# Patient Record
Sex: Female | Born: 1965 | Race: White | Hispanic: No | Marital: Single | Smoking: Former smoker
Health system: Southern US, Community
[De-identification: ages and names within clinical notes are randomized; demographics above are authoritative.]

## PROBLEM LIST (undated history)

## (undated) DIAGNOSIS — F419 Anxiety disorder, unspecified: Secondary | ICD-10-CM

## (undated) DIAGNOSIS — R7989 Other specified abnormal findings of blood chemistry: Secondary | ICD-10-CM

## (undated) DIAGNOSIS — E079 Disorder of thyroid, unspecified: Secondary | ICD-10-CM

## (undated) DIAGNOSIS — R5383 Other fatigue: Secondary | ICD-10-CM

## (undated) DIAGNOSIS — F32A Depression, unspecified: Secondary | ICD-10-CM

## (undated) DIAGNOSIS — R7303 Prediabetes: Secondary | ICD-10-CM

## (undated) DIAGNOSIS — M6289 Other specified disorders of muscle: Secondary | ICD-10-CM

## (undated) DIAGNOSIS — E669 Obesity, unspecified: Secondary | ICD-10-CM

## (undated) HISTORY — DX: Other fatigue: R53.83

## (undated) HISTORY — DX: Anxiety disorder, unspecified: F41.9

## (undated) HISTORY — DX: Depression, unspecified: F32.A

## (undated) HISTORY — DX: Other specified disorders of muscle: M62.89

## (undated) HISTORY — DX: Other specified abnormal findings of blood chemistry: R79.89

## (undated) HISTORY — DX: Obesity, unspecified: E66.9

## (undated) HISTORY — DX: Prediabetes: R73.03

## (undated) HISTORY — DX: Disorder of thyroid, unspecified: E07.9

---

## 1998-06-27 ENCOUNTER — Other Ambulatory Visit: Admission: RE | Admit: 1998-06-27 | Discharge: 1998-06-27 | Payer: Self-pay | Admitting: Obstetrics and Gynecology

## 2008-05-24 ENCOUNTER — Ambulatory Visit: Payer: Self-pay | Admitting: Family Medicine

## 2009-06-06 ENCOUNTER — Ambulatory Visit: Payer: Self-pay | Admitting: Family Medicine

## 2009-07-09 IMAGING — MG MAM DGTL SCREENING MAMMO W/CAD
1 series · 4 of 4 positions shown · non-contrast
Comparison: none

REASON FOR EXAM: scr mammo
COMMENTS:

PROCEDURE:     MAM - MAM DGTL SCREENING MAMMO W/CAD  - May 24, 2008  [DATE]
RESULT:       There are no prior mammograms for comparison.  The breast
parenchyma is almost entirely fatty.  No mass or malignant-appearing
calcifications are seen.

[R CC · right · 4 of 4 slices shown]
[im 1/4]
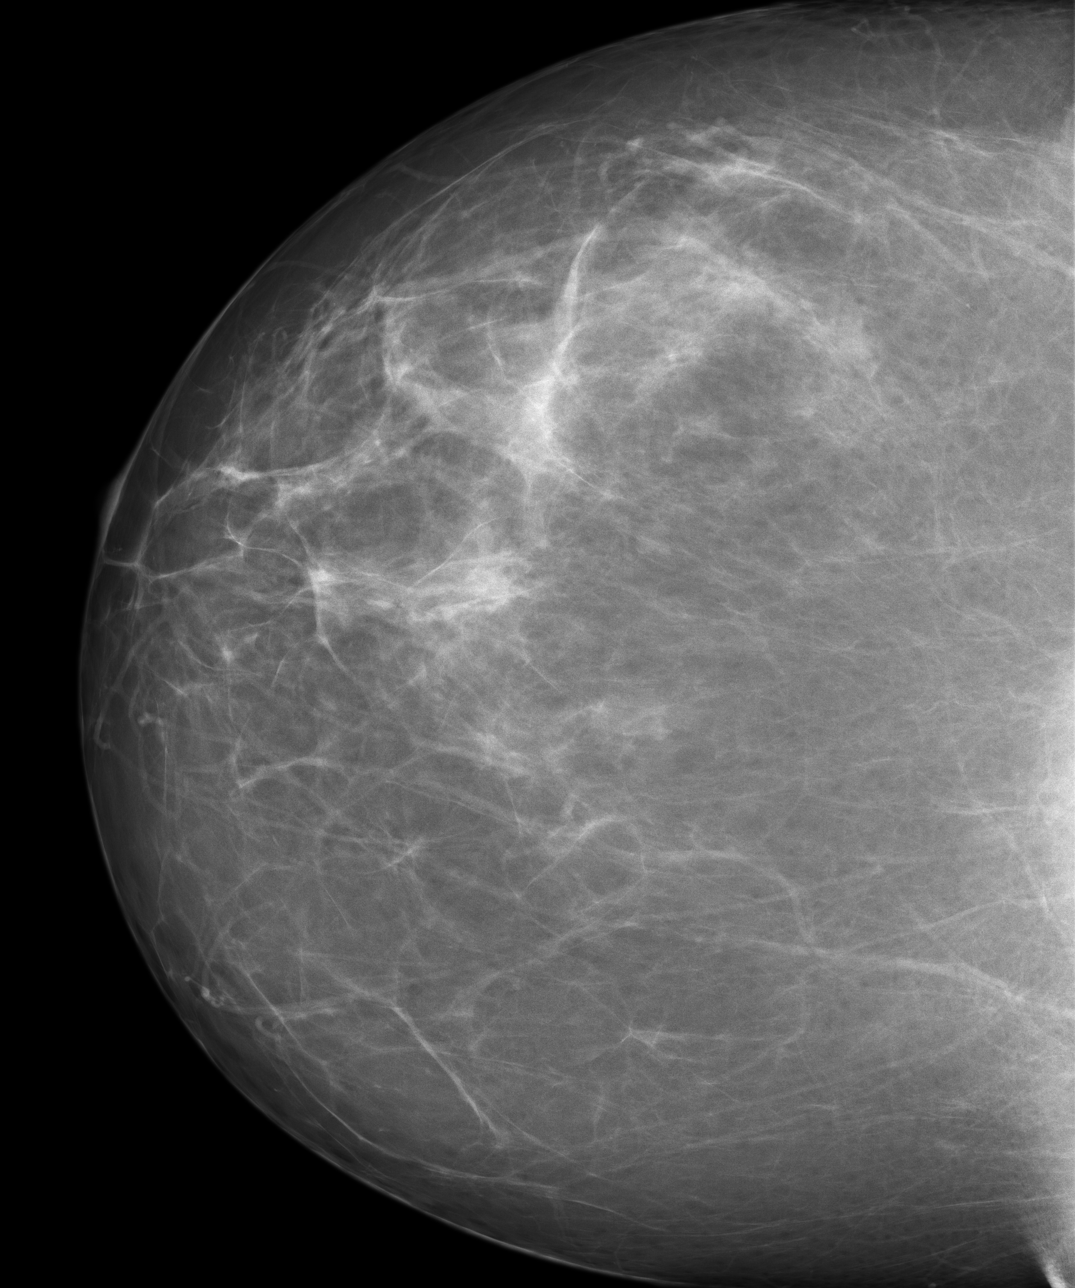
[im 2/4]
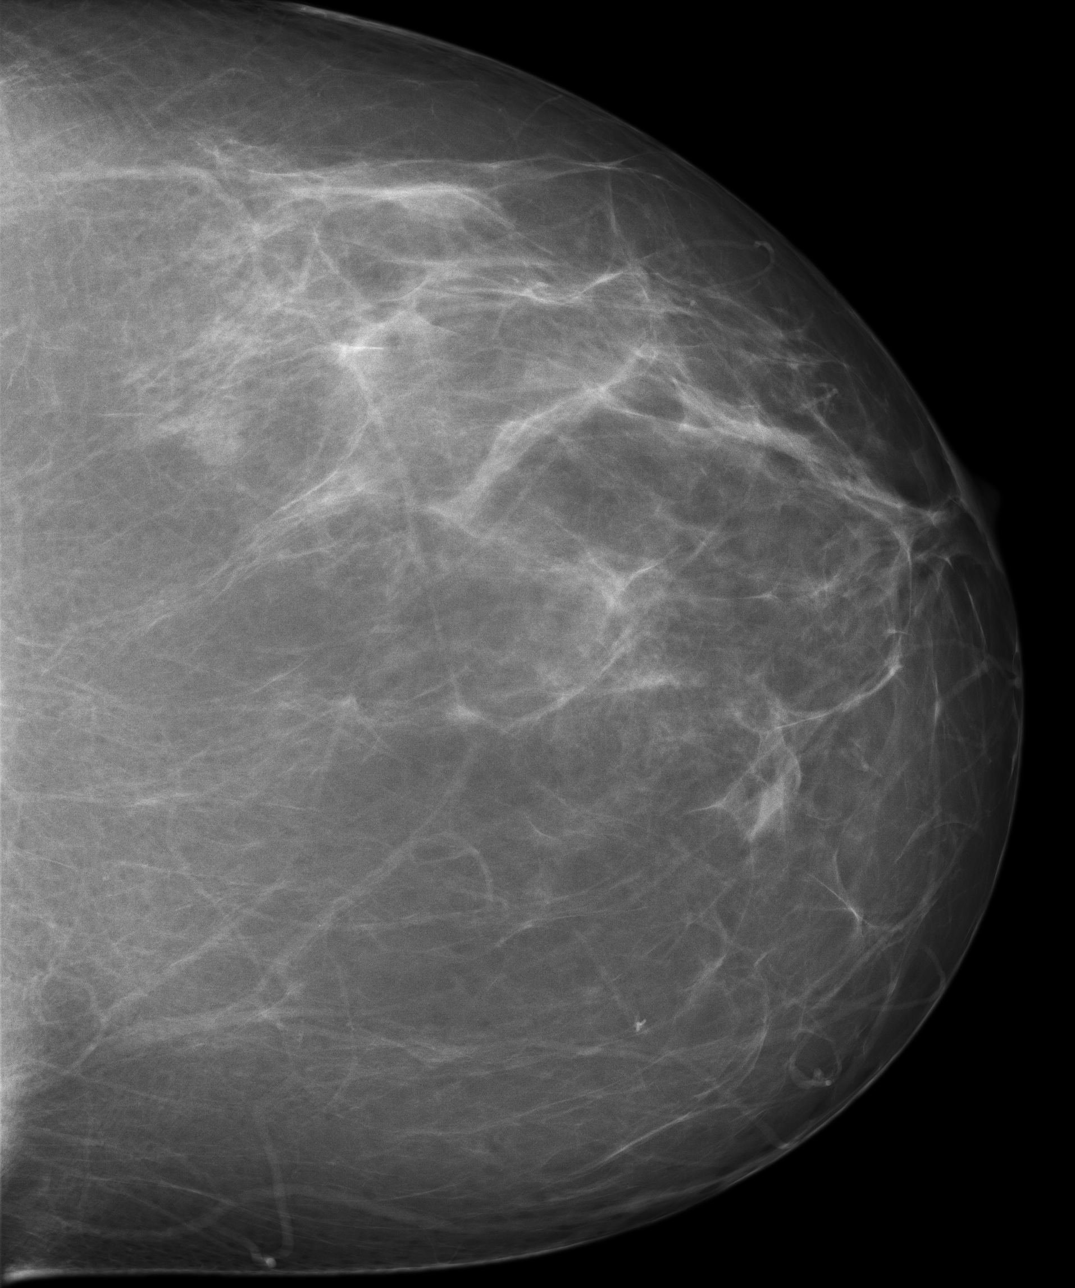
[im 3/4]
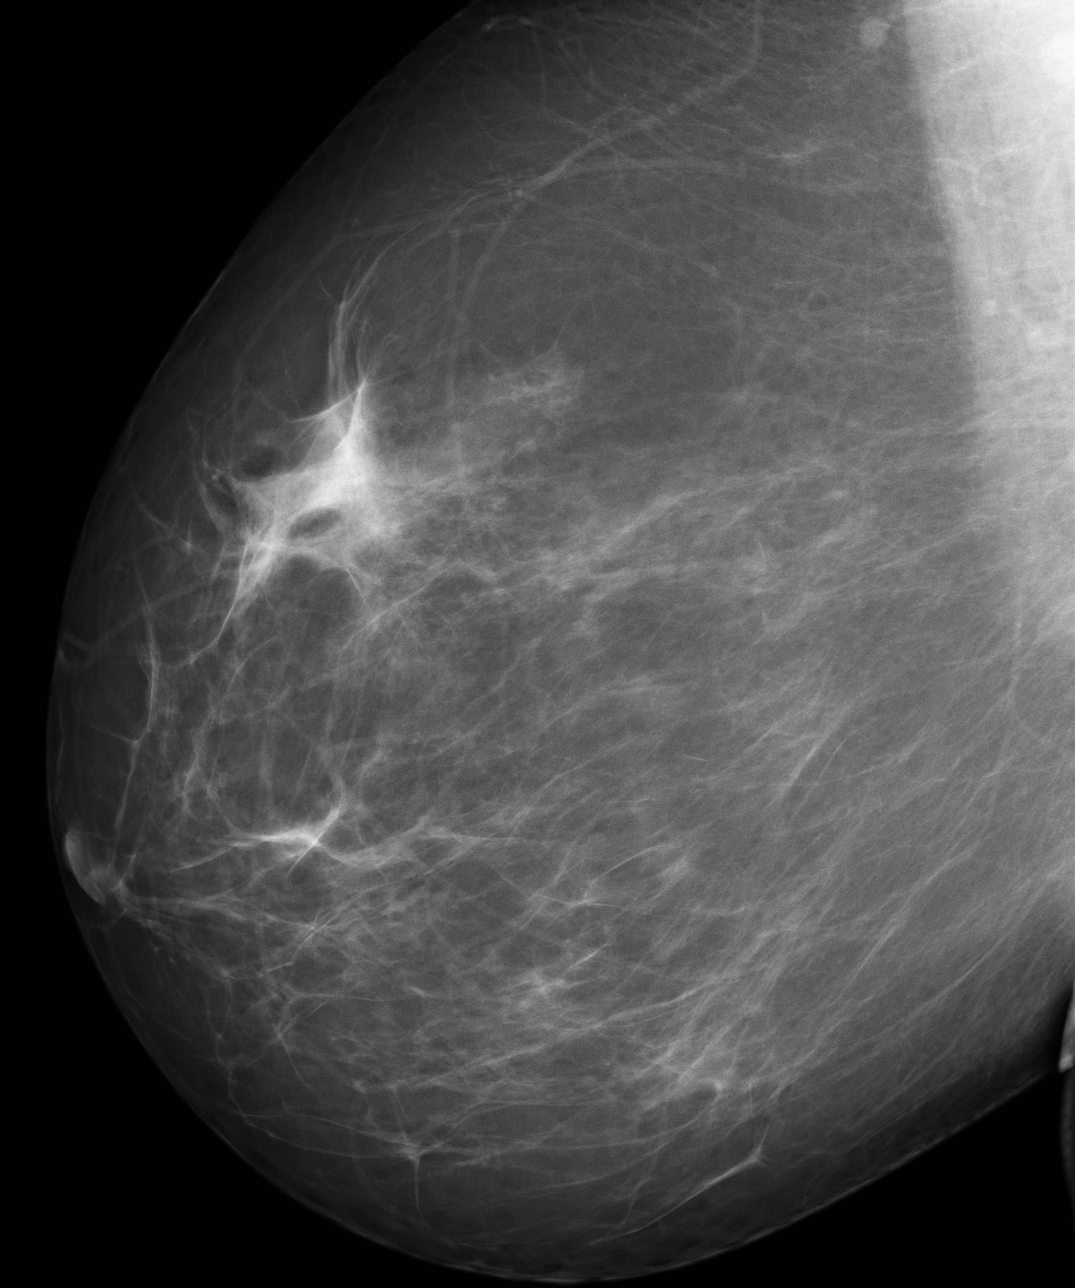
[im 4/4]
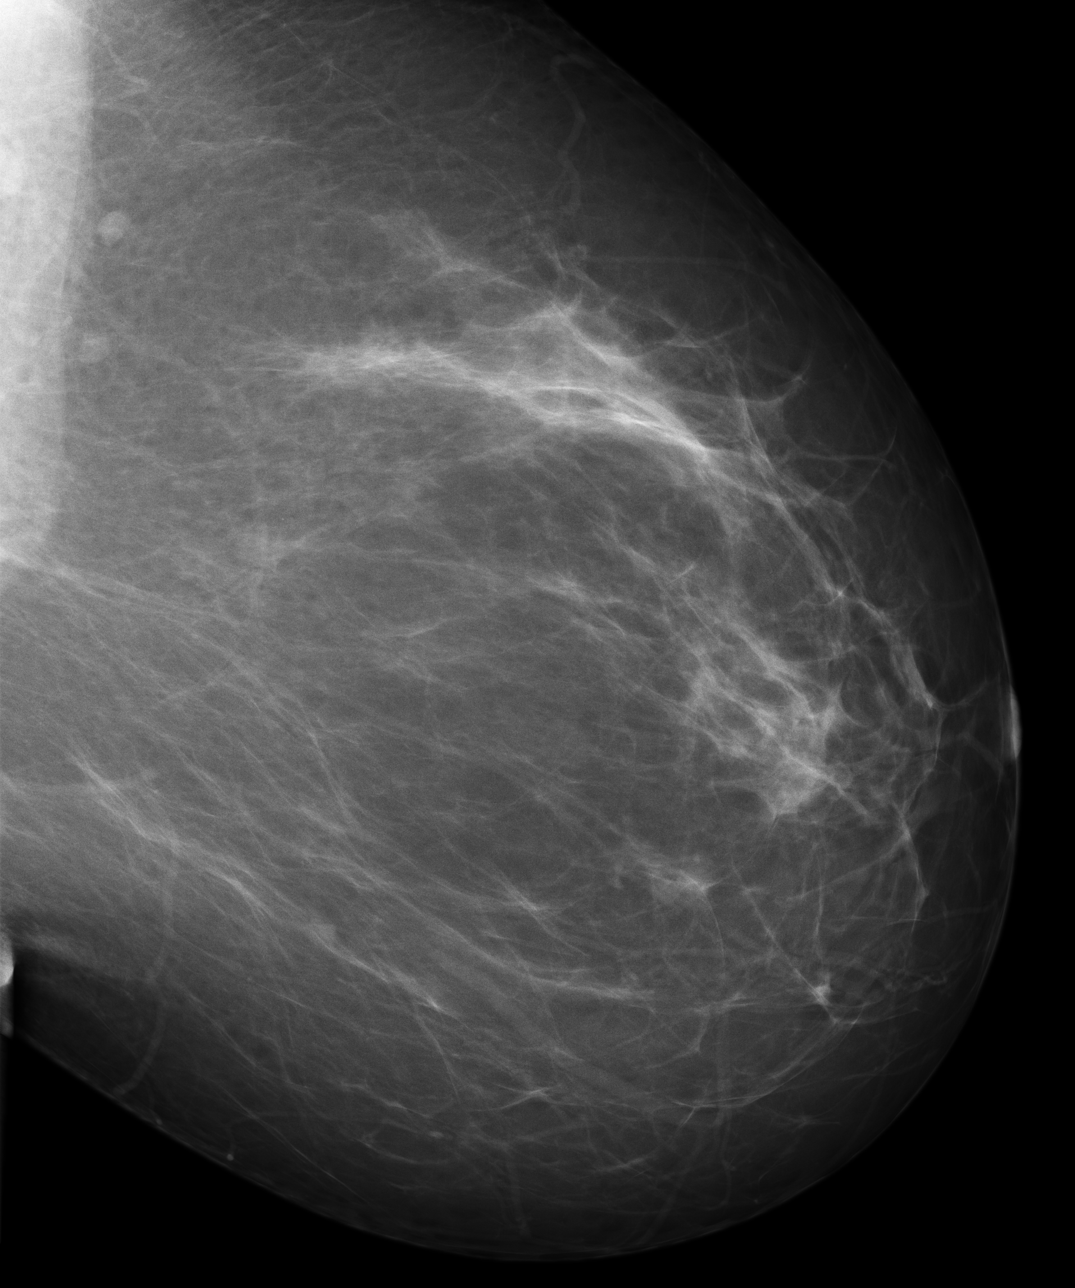

[4 of 4 positions shown; findings below may reference images not displayed]

IMPRESSION: 1.     Bilaterally benign-appearing baseline mammogram.
2.     Annual screening mammography is recommended.
3.     BI-RADS:  Category 1-Negative.

A NEGATIVE MAMMOGRAM REPORT DOES NOT PRECLUDE BIOPSY OR OTHER EVALUATION OF
A CLINICALLY PALPABLE OR OTHERWISE SUSPICIOUS MASS OR LESION.  BREAST CANCER
MAY NOT BE DETECTED BY MAMMOGRAPHY IN UP TO 10% OF CASES.

## 2009-07-10 ENCOUNTER — Ambulatory Visit: Payer: Self-pay | Admitting: Family Medicine

## 2009-08-08 ENCOUNTER — Ambulatory Visit: Payer: Self-pay | Admitting: Urology

## 2010-06-24 ENCOUNTER — Ambulatory Visit: Payer: Self-pay

## 2010-09-23 IMAGING — CT CT ABD-PELV W/ CM
1 of 3 series · 12 of 32 positions shown, 18 images · non-contrast
Comparison: none

REASON FOR EXAM: w delays  hematuria
COMMENTS:

[Series 2: with · axial · 0.79mm/px · z∈[+158,+562]mm · 12 of 97 slices shown, 18 images]
[im 8/97  soft-tissue]
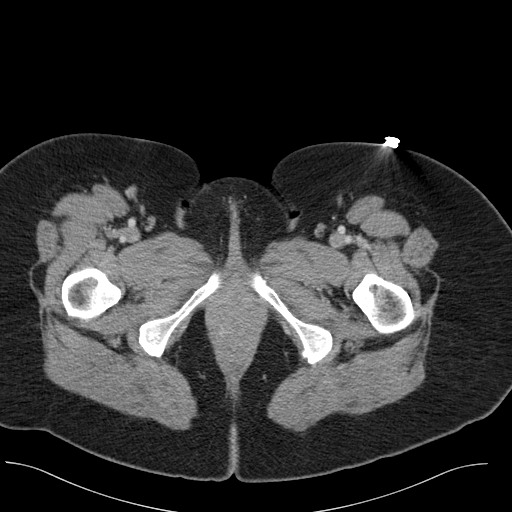
[im 8/97  bone]
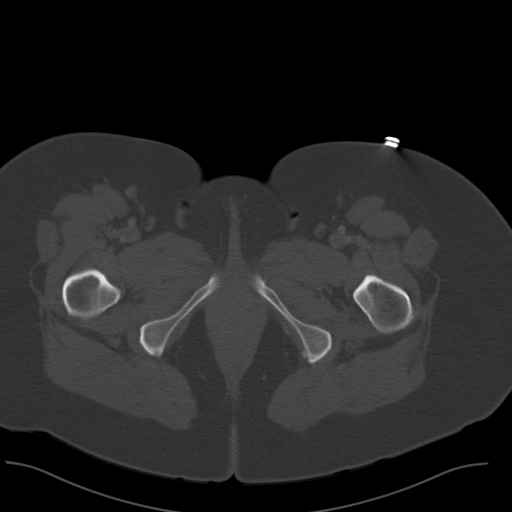
[im 15/97  soft-tissue]
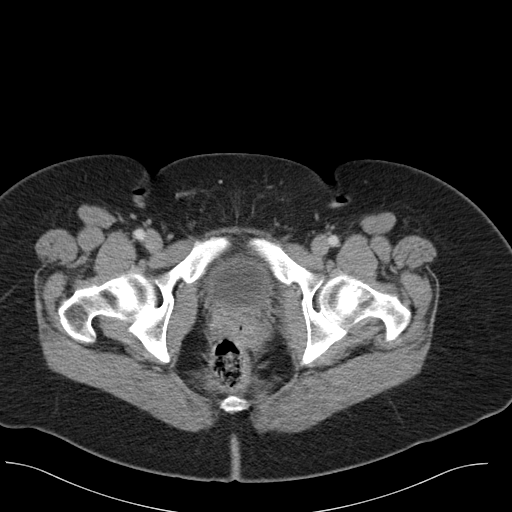
[im 23/97  soft-tissue]
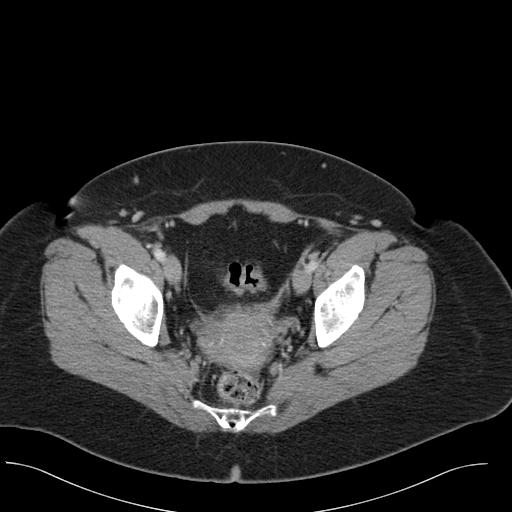
[im 30/97  soft-tissue]
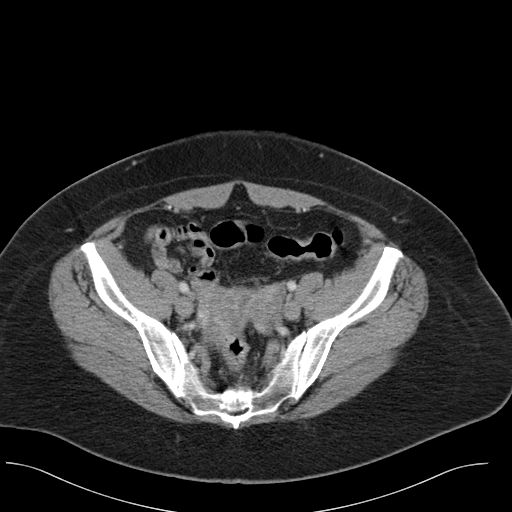
[im 37/97  soft-tissue]
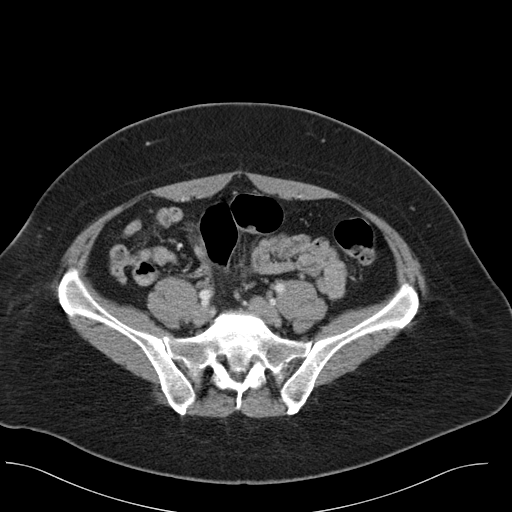
[im 45/97  soft-tissue]
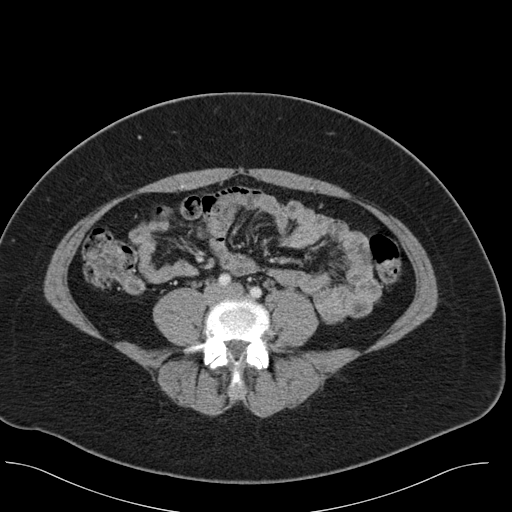
[im 52/97  soft-tissue]
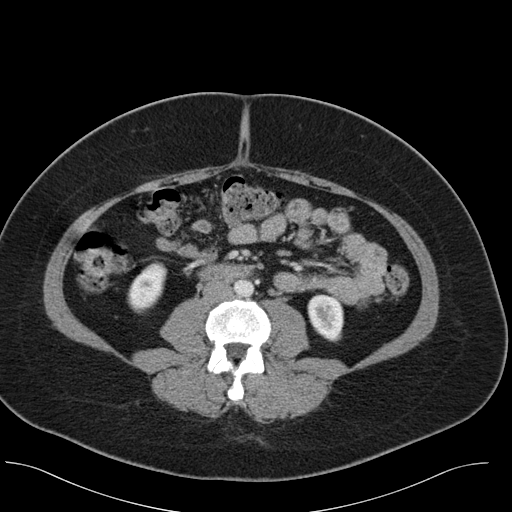
[im 60/97  soft-tissue]
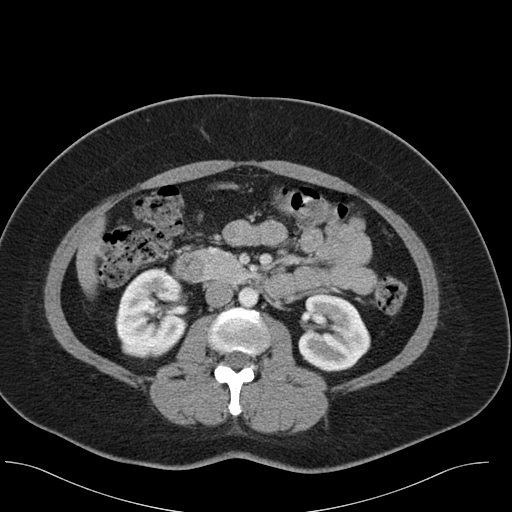
[im 67/97  soft-tissue]
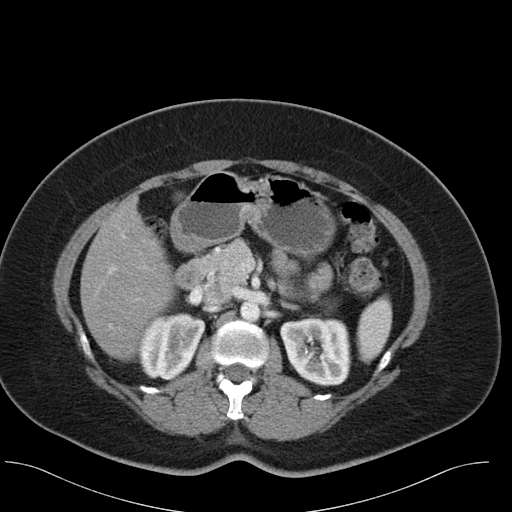
[im 67/97  lung]
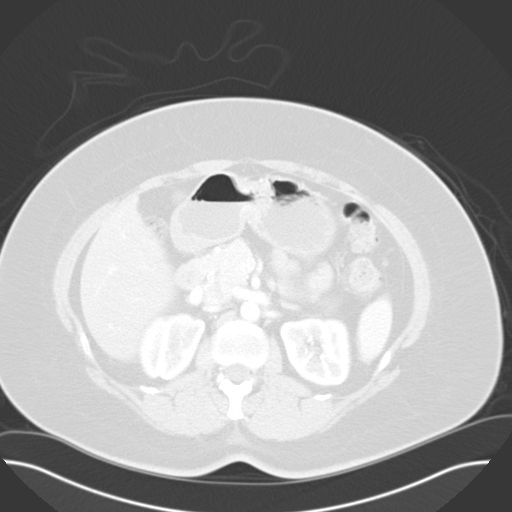
[im 67/97  bone]
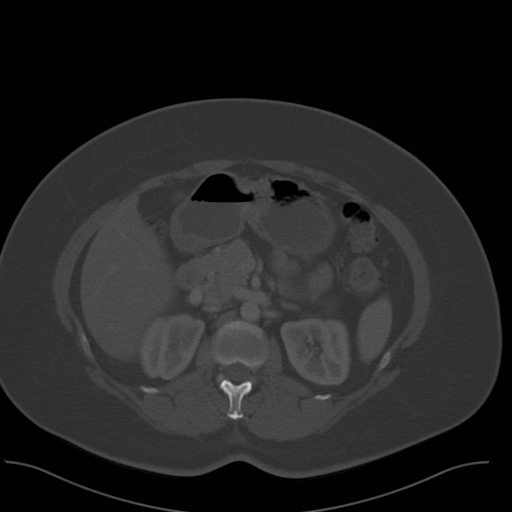
[im 74/97  soft-tissue]
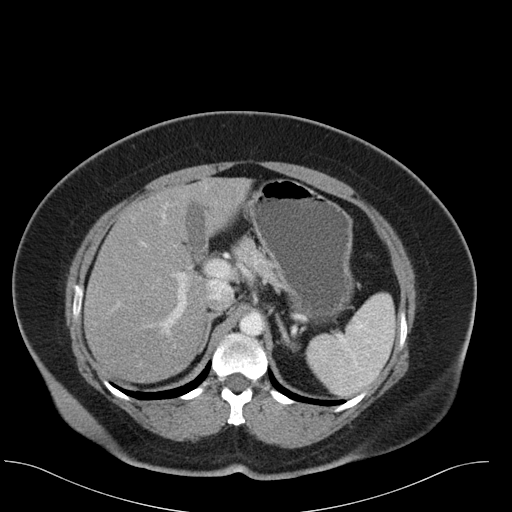
[im 74/97  lung]
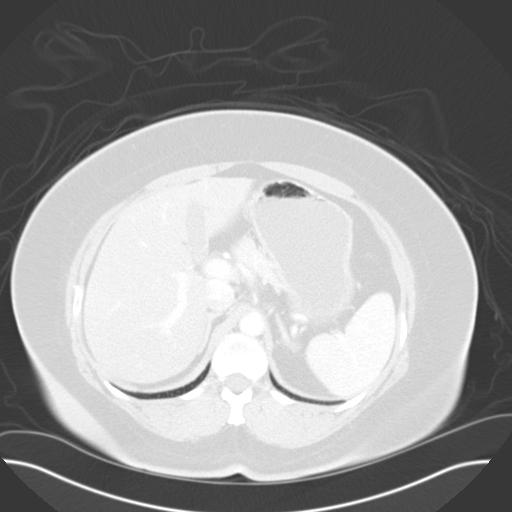
[im 82/97  soft-tissue]
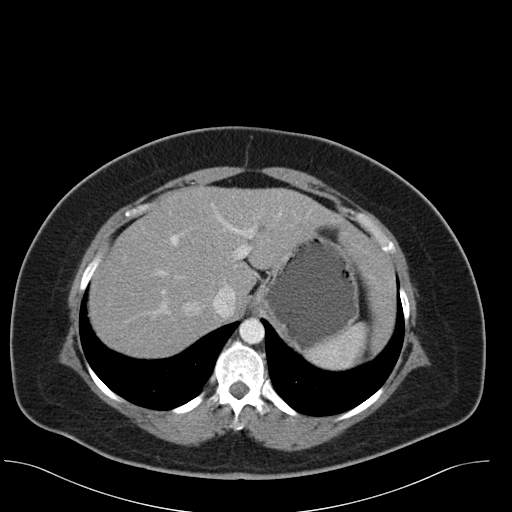
[im 82/97  lung]
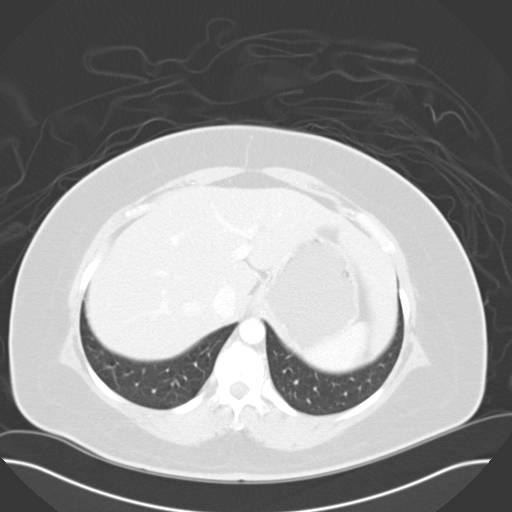
[im 89/97  soft-tissue]
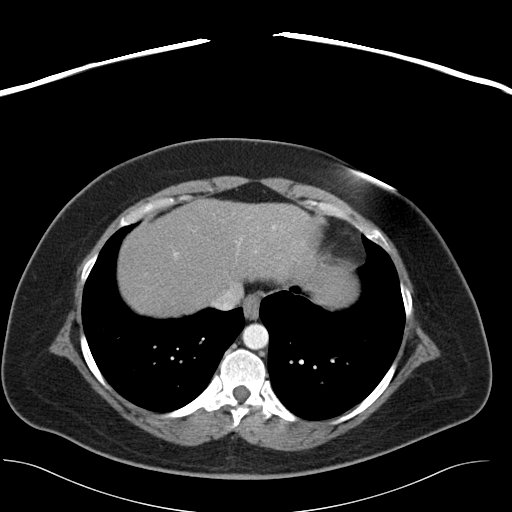
[im 89/97  lung]
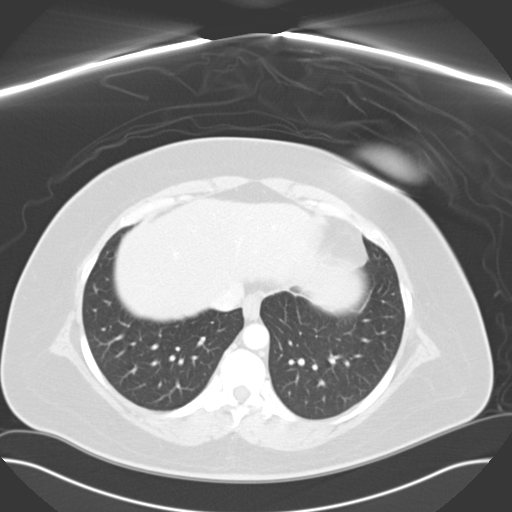

[12 of 32 positions shown; findings below may reference images not displayed]

PROCEDURE:     CT  - CT ABDOMEN / PELVIS  W  - August 08, 2009  [DATE]

RESULT:     Axial CT scanning was performed through the abdomen and pelvis
at 5 mm intervals and slice thicknesses following intravenous administration
of 100 cc of [id number]. Delayed images were obtained as well. Review of
3-dimensional reconstructed images was performed separately on the [employer]
Server monitor.

The kidneys enhance well. There is a nonobstructing mid pole stone measuring
approximately 2 mm in diameter on the left. A calcification on image 39 in
the mid to lower pole on the left may reflect a 3 mm diameter nonobstructing
stone or be related to a vascular location. I do not see definite stones on
the right. The perinephric fat is normal in appearance. On delayed images
contrast within the renal collecting systems is normal. The ureters are
normal in course and caliber. The partially distended urinary bladder is
grossly normal.

The liver exhibits no focal mass nor ductal dilation. The gallbladder,
moderately distended stomach, pancreas, spleen, and adrenal glands are
normal in appearance. The caliber of the abdominal aorta is normal. I see no
periaortic or pericaval lymphadenopathy. Within the pelvis the uterus and
adnexal structures exhibit no definite acute abnormality. There are likely
cystic ovarian processes bilaterally. The uterus also appears bicornuate. I
see no evidence of an inguinal hernia. The lung bases are clear. The lumbar
vertebral bodies are preserved in height.
IMPRESSION: 1. I do not see acute abnormality of the kidneys or ureters. The urinary
bladder is partially distended and is grossly normal. If hematuria persists
and remains unexplained, cystoscopy may be useful.
2. There is mild fullness of the adnexal regions which may reflect cystic
ovarian processes. The uterus may be bicornuate is well. Pelvic ultrasound
is recommended.
3. I do not see acute bowel abnormality nor acute hepatobiliary abnormality.

## 2011-06-30 ENCOUNTER — Ambulatory Visit: Payer: Self-pay | Admitting: Family Medicine

## 2012-07-28 ENCOUNTER — Ambulatory Visit: Payer: Self-pay | Admitting: Family Medicine

## 2012-08-14 IMAGING — MG MM CAD SCREENING MAMMO
1 series · 3 of 3 positions shown · non-contrast
Comparison: none

REASON FOR EXAM: scr mammo no order
COMMENTS:

PROCEDURE:     MAM - MAM DGTL SCRN MAM NO ORDER W/CAD  - June 30, 2011  [DATE]
RESULT:     Comparison is made to prior examinations June 24, 2010
and May 24, 2008.
They are scattered fibroglandular densities bilaterally. A few retromammary
and axillary lymph nodes are observed bilaterally.

[L CC · left · 3 of 3 slices shown]
[im 1/3]
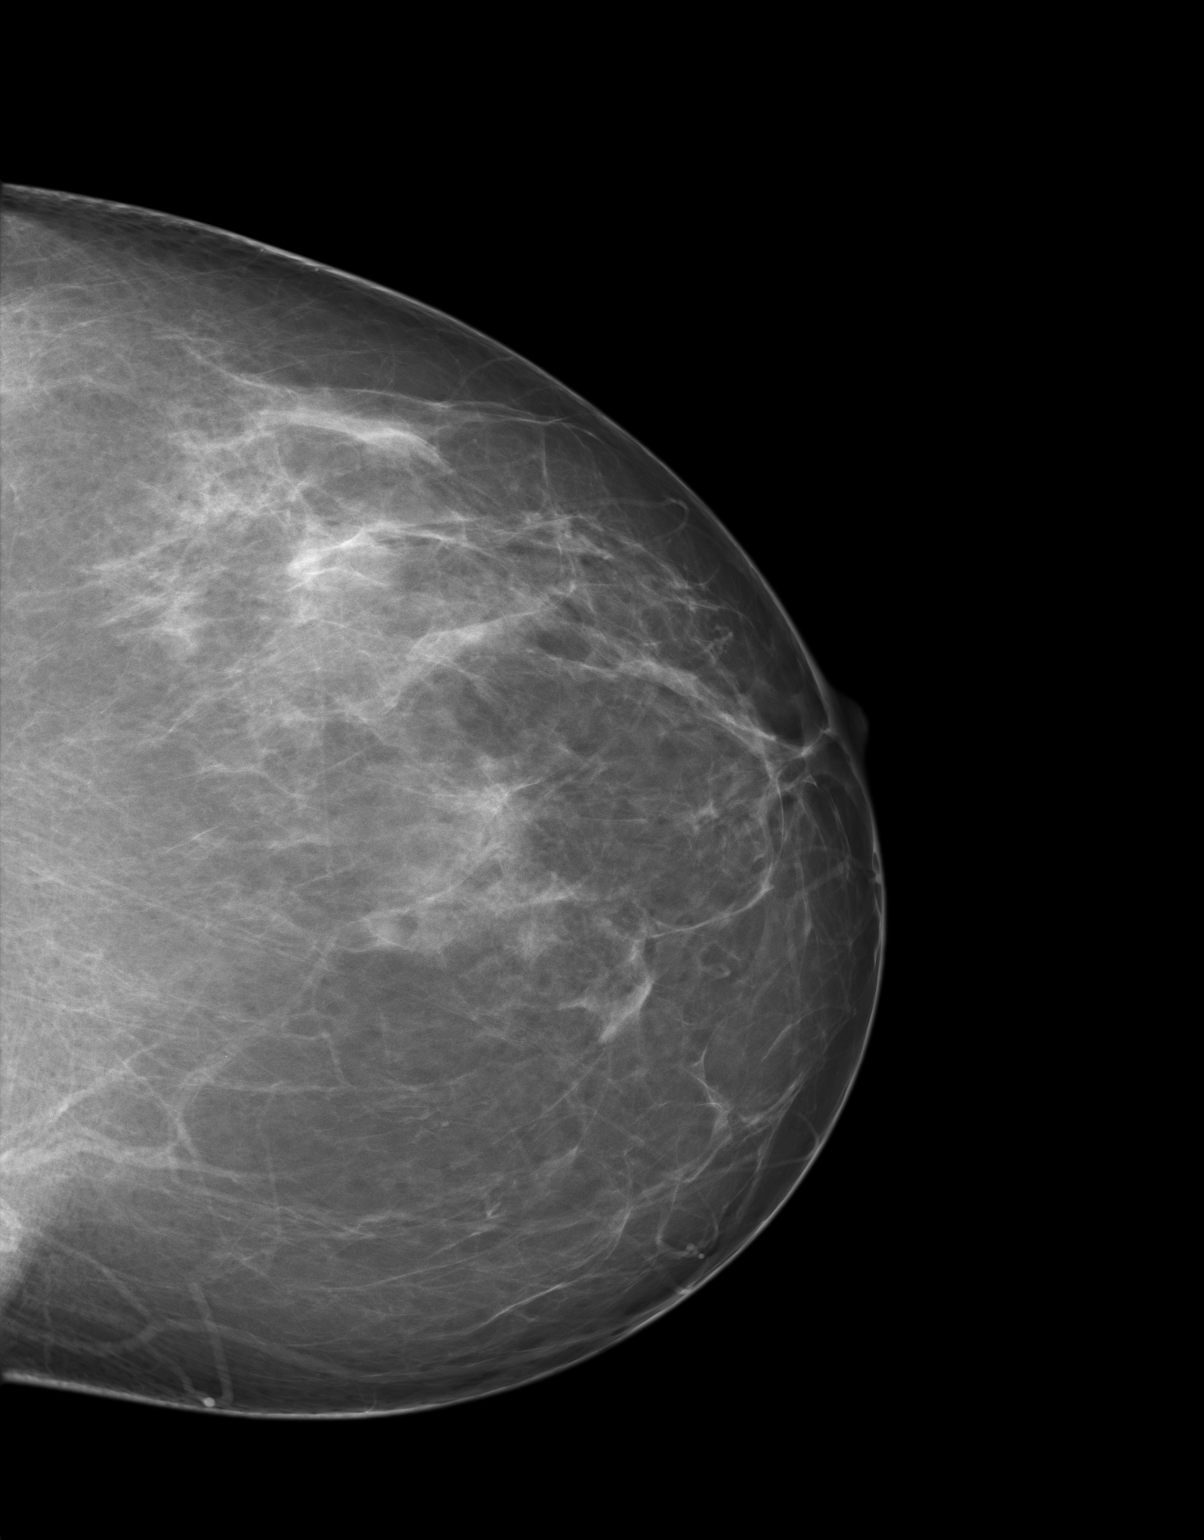
[im 2/3]
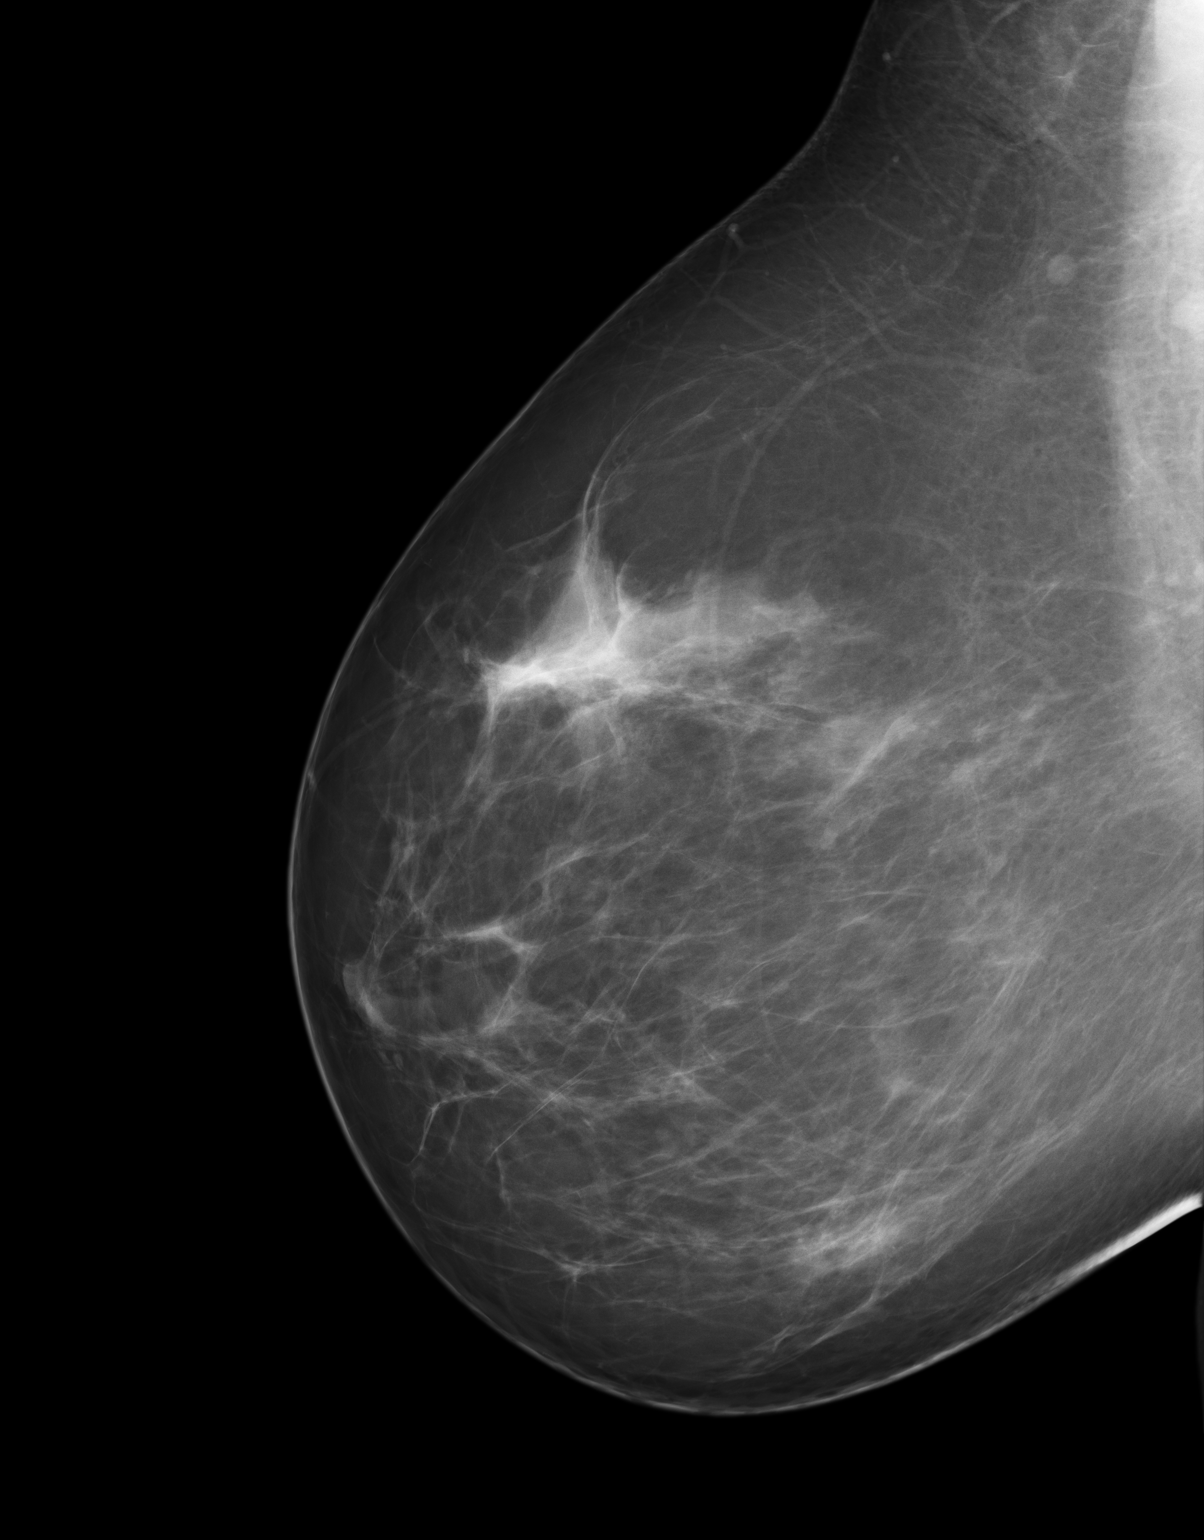
[im 3/3]
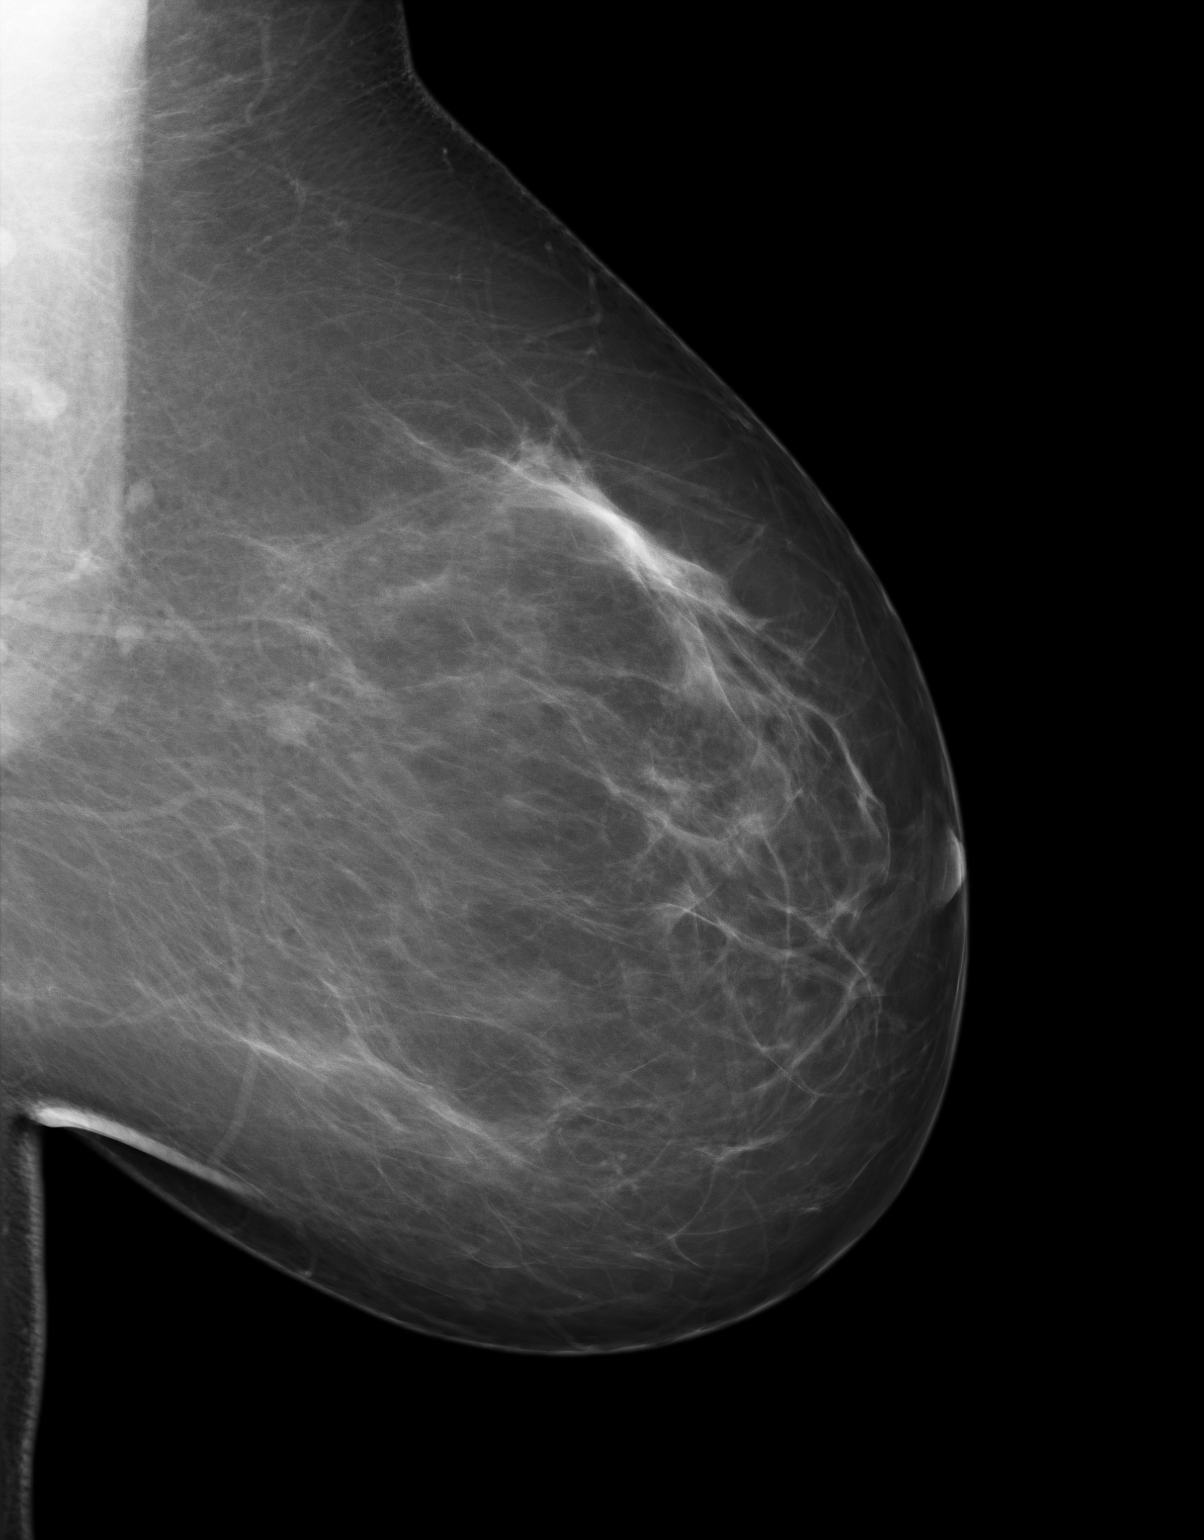

[3 of 3 positions shown; findings below may reference images not displayed]

IMPRESSION: 1. Bilateral benign-appearing screening mammography.
3. Continued annual screening mammography is recommended.
3. BI-RADS:  Category 2- Benign Finding.

A negative mammogram report does not preclude biopsy or other evaluation of
a clinically palpable or otherwise suspicious mass or lesion.  Breast cancer
may not be detected by mammography in up to 10% of cases.

## 2014-06-25 ENCOUNTER — Ambulatory Visit: Payer: Self-pay | Admitting: Family Medicine

## 2015-06-24 ENCOUNTER — Encounter: Payer: Self-pay | Admitting: Physician Assistant

## 2015-06-24 ENCOUNTER — Ambulatory Visit (INDEPENDENT_AMBULATORY_CARE_PROVIDER_SITE_OTHER): Payer: 59 | Admitting: Physician Assistant

## 2015-06-24 VITALS — BP 132/62 | HR 85 | Resp 18 | Ht 60.0 in | Wt 234.2 lb

## 2015-06-24 DIAGNOSIS — S61011A Laceration without foreign body of right thumb without damage to nail, initial encounter: Secondary | ICD-10-CM

## 2015-06-24 DIAGNOSIS — M2550 Pain in unspecified joint: Secondary | ICD-10-CM

## 2015-06-24 DIAGNOSIS — S61219A Laceration without foreign body of unspecified finger without damage to nail, initial encounter: Secondary | ICD-10-CM

## 2015-06-24 NOTE — Progress Notes (Signed)
   06/24/2015 8:45 PM   DOB: 1966/03/17 / MRN: [id number]  SUBJECTIVE:  Christina CablesRebecca Brennan is a 49 y.o. female presenting for after slicing the tip of her right thumb off with a mandolin slicer at roughly 7 pm.  She has had a difficult time stopping the bleeding and has kept her thumb in her mouth to aid in hemostasis.  Last tetanus shot was 8 years ago. She denies difficulty with ROM of the thumb.   She has No Known Allergies.   She  has no past medical history on file.    She   She  has no sexual activity history on file. The patient  has no past surgical history on file.  Her family history is not on file.  Review of Systems  Constitutional: Negative for fever.  Musculoskeletal: Positive for joint pain. Negative for myalgias.  Neurological: Negative for tingling.    Problem list and medications reviewed and updated by myself where necessary, and exist elsewhere in the encounter.   OBJECTIVE:  BP 132/62 mmHg  Pulse 85  Resp 18  Ht 5' (1.524 m)  Wt 234 lb 3.2 oz (106.232 kg)  BMI 45.74 kg/m2  SpO2 98%  LMP 06/10/2015 CrCl cannot be calculated (Patient has no serum creatinine result on file.).  Physical Exam  Constitutional: She is oriented to person, place, and time.  Cardiovascular: Normal rate.   Pulmonary/Chest: Effort normal.  Musculoskeletal:       Hands: Neurological: She is alert and oriented to person, place, and time.  Skin: Skin is warm and dry.  Psychiatric: Her mood appears anxious.   Risk and benefits discussed and verbal consent obtained. Anesthetic allergies reviewed. Patient anesthetized using 1:1 mix of 2% lidocaine without epi and Marcaine. The wound was cleansed thoroughly with NS and betadine prep. Sterile drape. Wound closed with aluminum foil scrubbed times 3 with betadine. Foil adhered using 5 SI throws using 4-0 Ethilon suture material. Hemostasis achieved. Mupirocin applied to the wound and bandage placed. The patient tolerated well. Wound instructions  were provided and the patient is to return in 10 days for suture removal.   No results found for this or any previous visit (from the past 48 hour(s)).  ASSESSMENT AND PLAN  Christina JoinerRebecca was seen today for other.  Diagnoses and all orders for this visit:  Finger laceration, initial encounter: Advised ibuprofen 600-800 mg q8 for pain control. Advised Vaseline and band-aid for future dressing changes.  If any problems she should return to clinic, otherwise will see her back in 10 days for foil and suture removal.     The patient was advised to call or return to clinic if she does not see an improvement in symptoms or to seek the care of the closest emergency department if she worsens with the above plan.   Christina BostonMichael Brennan, MHS, PA-C [facility] 06/24/2015 8:45 PM

## 2015-06-24 NOTE — Patient Instructions (Signed)
Please take 600-800 mg of Ibuprofen every 8 hours for pain. When caring for the wound, please change the dressing every 24 hours and replace tonight's bandage with a bandaid and vaseline.  WOUND CARE Please return in 10 days to have your stitches/staples removed or sooner if you have concerns. Marland Kitchen. Keep area clean and dry for 24 hours. Do not remove bandage, if applied. . After 24 hours, remove bandage and wash wound gently with mild soap and warm water. Reapply a new bandage after cleaning wound, if directed. . Continue daily cleansing with soap and water until stitches/staples are removed. . Do not apply any ointments or creams to the wound while stitches/staples are in place, as this may cause delayed healing. . Notify the office if you experience any of the following signs of infection: Swelling, redness, pus drainage, streaking, fever >101.0 F . Notify the office if you experience excessive bleeding that does not stop after 15-20 minutes of constant, firm pressure.

## 2015-06-26 ENCOUNTER — Telehealth: Payer: Self-pay | Admitting: Physician Assistant

## 2015-06-26 NOTE — Telephone Encounter (Signed)
Pt called to confirm that she had changed her dressing; there was only a little bit of blood and she states she will be able to come in for the 10 day follow up. Thank you!  [phone]

## 2015-07-09 ENCOUNTER — Ambulatory Visit (INDEPENDENT_AMBULATORY_CARE_PROVIDER_SITE_OTHER): Payer: 59 | Admitting: Physician Assistant

## 2015-07-09 VITALS — BP 120/60 | HR 100 | Temp 98.3°F | Resp 18

## 2015-07-09 DIAGNOSIS — S61219D Laceration without foreign body of unspecified finger without damage to nail, subsequent encounter: Secondary | ICD-10-CM

## 2015-07-09 DIAGNOSIS — S61011D Laceration without foreign body of right thumb without damage to nail, subsequent encounter: Secondary | ICD-10-CM

## 2015-07-09 DIAGNOSIS — Z4802 Encounter for removal of sutures: Secondary | ICD-10-CM

## 2015-07-09 NOTE — Progress Notes (Signed)
   Christina CablesRebecca Brennan  MRN: [id number] DOB: October 09, 1965  Subjective:  Pt presents to clinic for a wound recheck.  She had a foil graft placed on 11/28 after an avulsion injury on her right thumb.  It is still tender when she touches it but it is getting better.    There are no active problems to display for this patient.   No current outpatient prescriptions on file prior to visit.   No current facility-administered medications on file prior to visit.    No Known Allergies  Review of Systems  Constitutional: Negative for fever and chills.   Objective:  BP 120/60 mmHg  Pulse 100  Temp(Src) 98.3 F (36.8 C) (Oral)  Resp 18  SpO2 97%  LMP 06/10/2015  Physical Exam  Constitutional: She is oriented to person, place, and time and well-developed, well-nourished, and in no distress.  HENT:  Head: Normocephalic and atraumatic.  Right Ear: Hearing and external ear normal.  Left Ear: Hearing and external ear normal.  Eyes: Conjunctivae are normal.  Neck: Normal range of motion.  Pulmonary/Chest: Effort normal.  Neurological: She is alert and oriented to person, place, and time. Gait normal.  Skin: Skin is warm and dry.  Foil graft removed - about 50% healed but the foil was not attached fully so it had to be removed - xeroform gauze placed over the wound and then a drsg placed.  Psychiatric: Mood, memory, affect and judgment normal.  Vitals reviewed.   Assessment and Plan :  Finger laceration, subsequent encounter - Plan: Apply dressing  Encounter for removal of sutures   Wound care d/w pt.  Christina LennertSarah Leata Dominy PA-C  [facility] 07/09/2015 5:37 PM

## 2015-08-10 IMAGING — MG MM DIGITAL SCREENING BILAT W/ CAD
5 series · 5 of 5 positions shown · non-contrast
Comparison: Previous exam(s).

CLINICAL DATA: Screening.

EXAM:
DIGITAL SCREENING BILATERAL MAMMOGRAM WITH CAD

[R MLO]
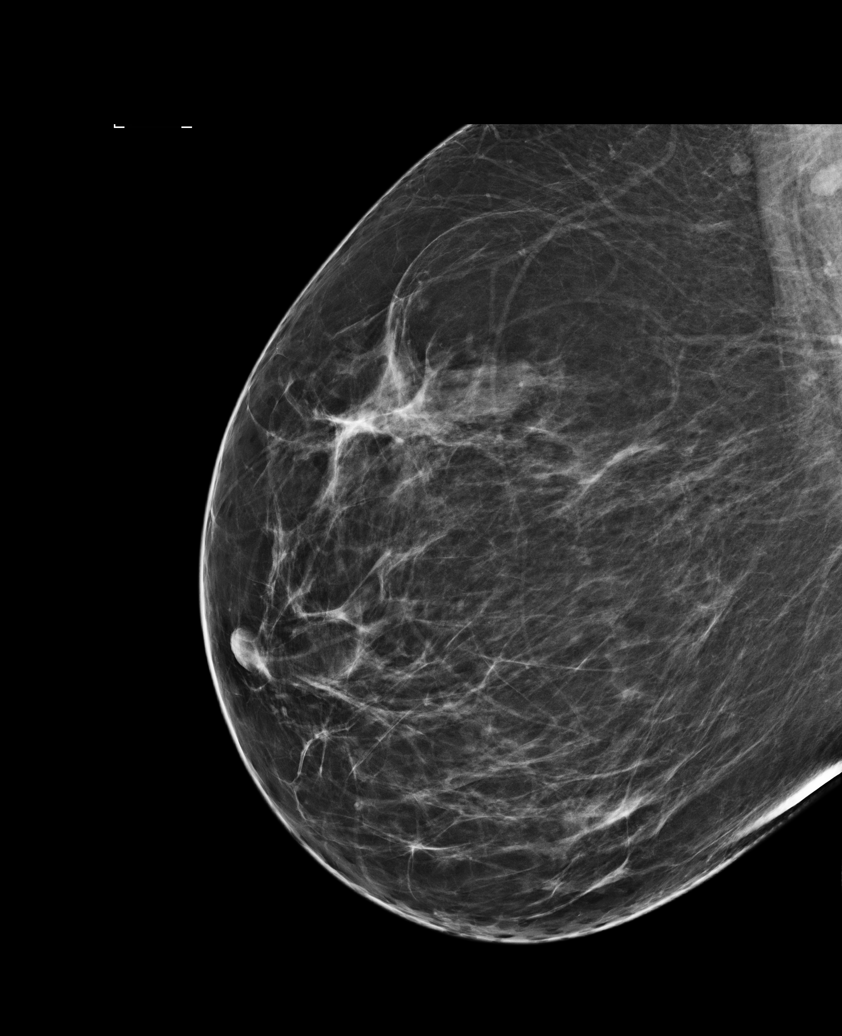

[L MLO (1 of 2)]
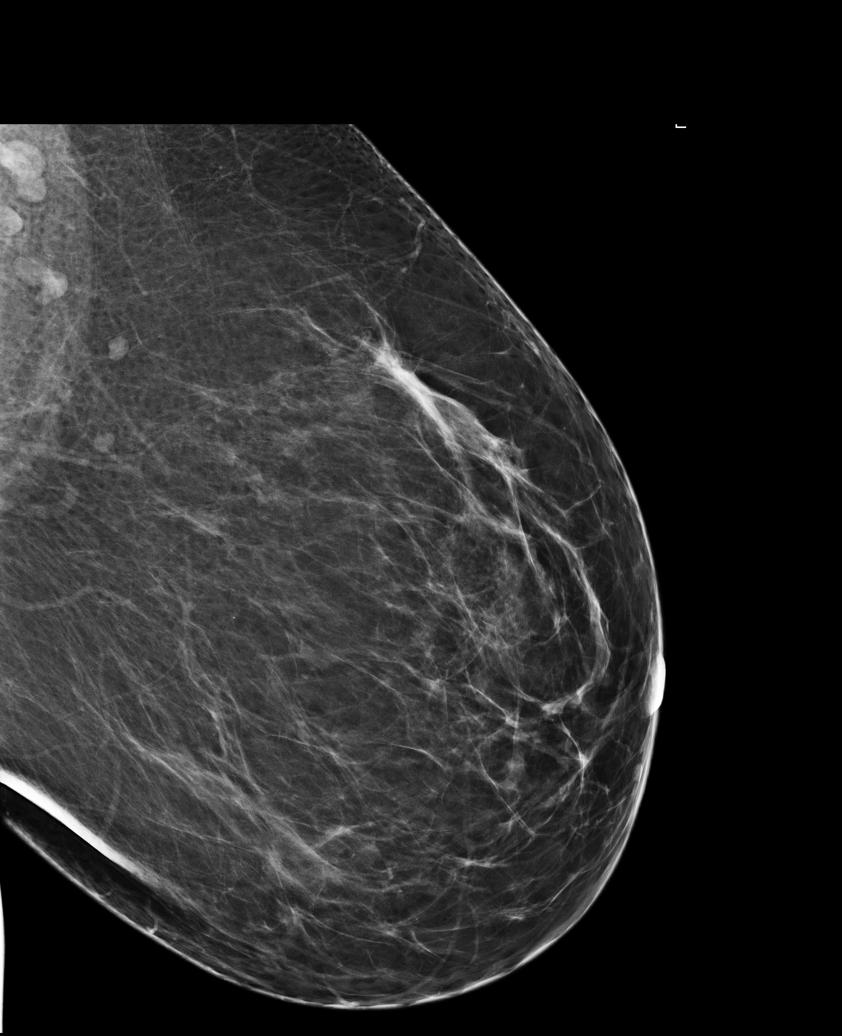

[R CC]
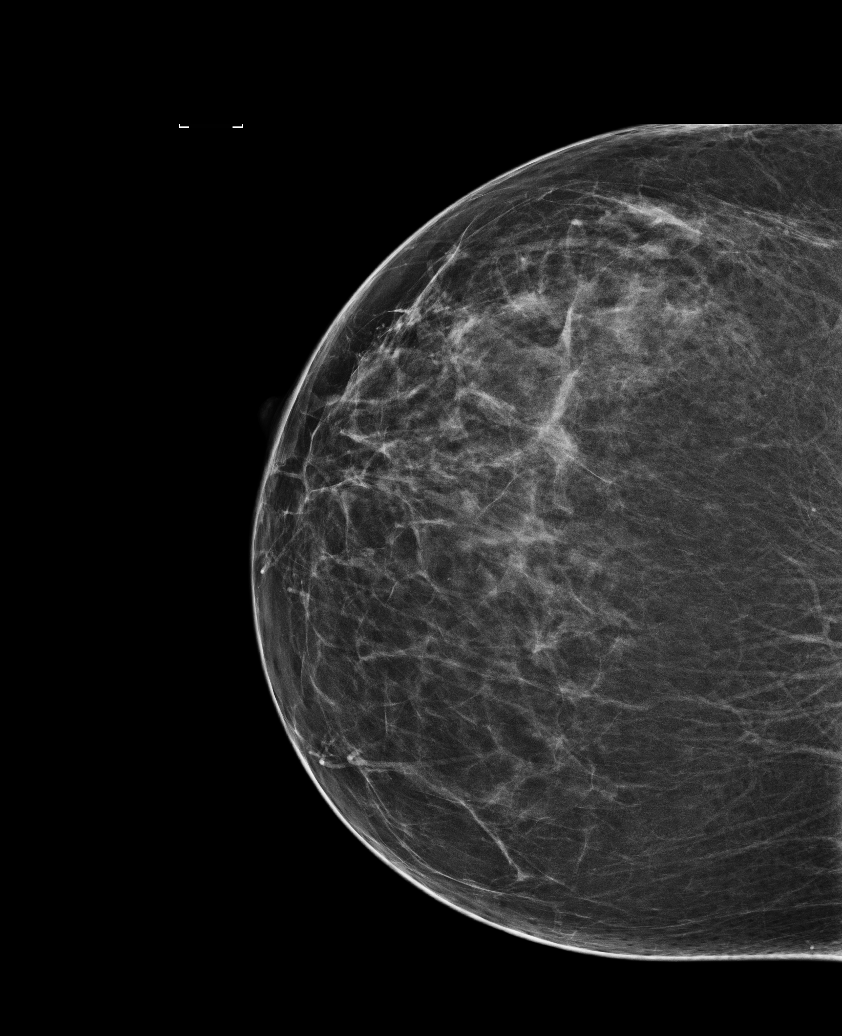

[L MLO (2 of 2)]
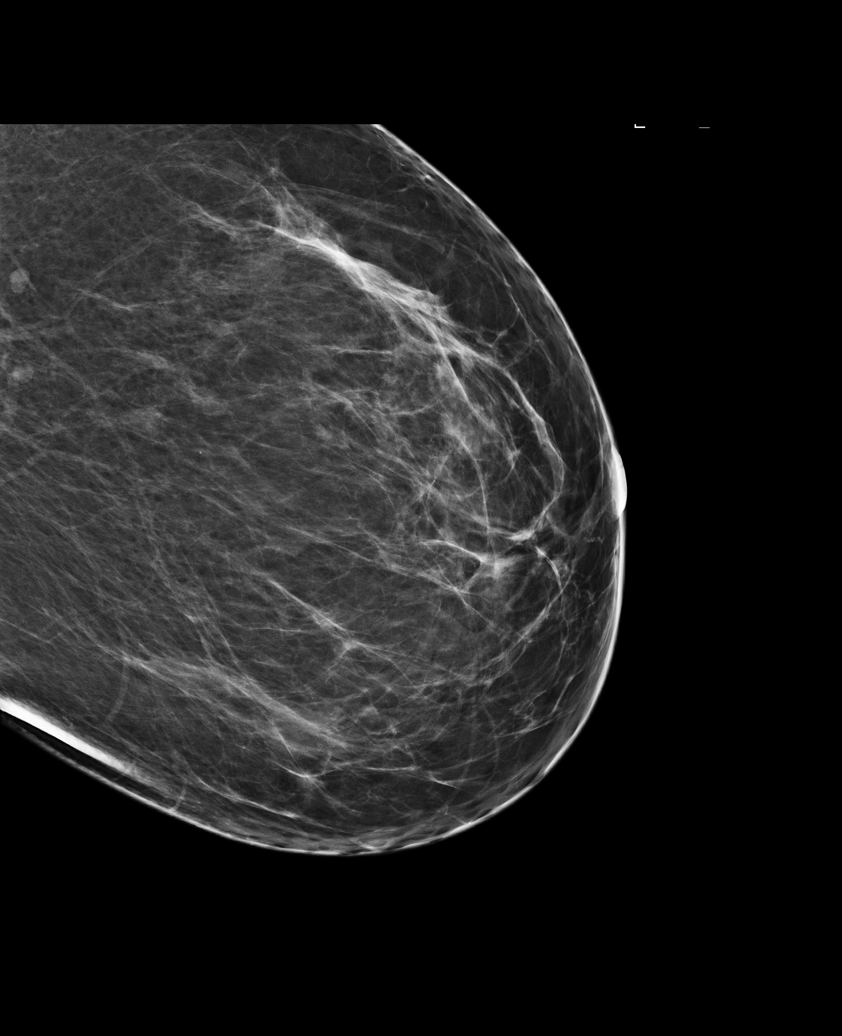

[L CC]
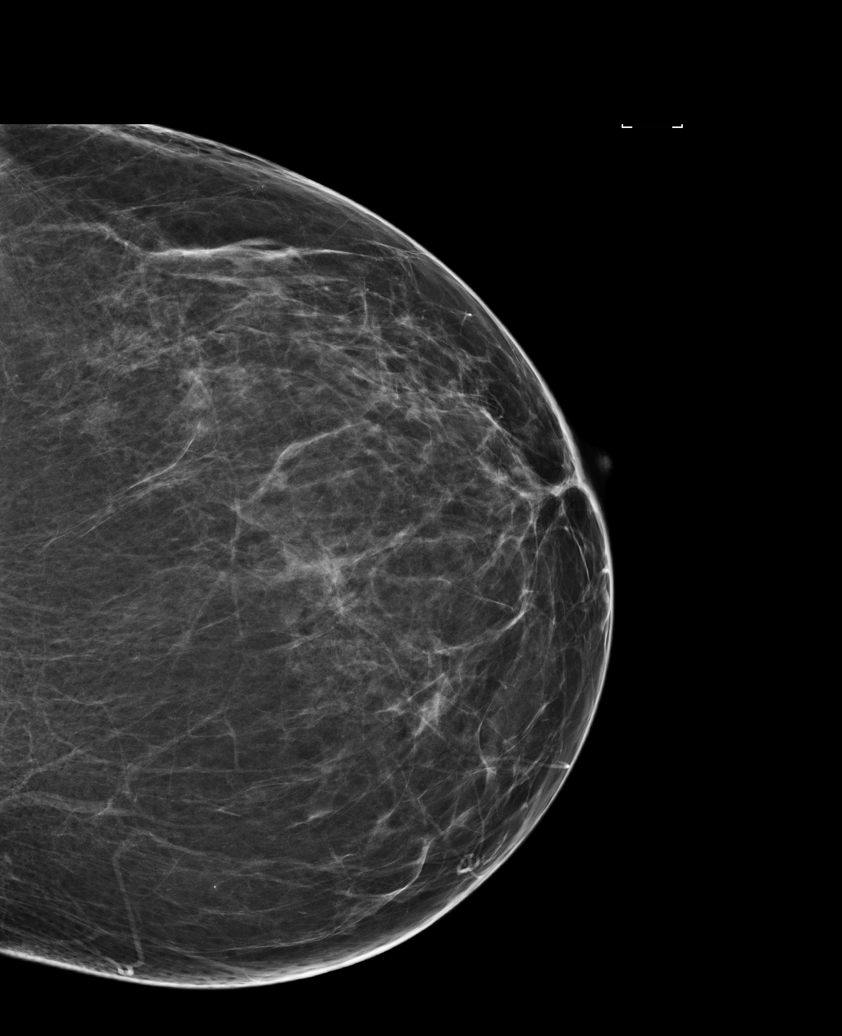

[5 of 5 positions shown; findings below may reference images not displayed]

ACR Breast Density Category b: There are scattered areas of
fibroglandular density.
FINDINGS: There are no findings suspicious for malignancy. Images were
processed with CAD.
IMPRESSION: No mammographic evidence of malignancy. A result letter of this
screening mammogram will be mailed directly to the patient.

RECOMMENDATION:
Screening mammogram in one year. (Code:AS-G-LCT)

BI-RADS CATEGORY  1: Negative.

## 2016-06-01 ENCOUNTER — Other Ambulatory Visit: Payer: Self-pay | Admitting: Adult Health

## 2016-06-01 DIAGNOSIS — Z1231 Encounter for screening mammogram for malignant neoplasm of breast: Secondary | ICD-10-CM

## 2016-06-25 ENCOUNTER — Ambulatory Visit
Admission: RE | Admit: 2016-06-25 | Discharge: 2016-06-25 | Disposition: A | Discharge status: Home / Self Care | Payer: 59 | Source: Ambulatory Visit | Attending: Adult Health | Admitting: Adult Health

## 2016-06-25 DIAGNOSIS — Z1231 Encounter for screening mammogram for malignant neoplasm of breast: Secondary | ICD-10-CM | POA: Diagnosis not present

## 2016-06-25 DIAGNOSIS — R928 Other abnormal and inconclusive findings on diagnostic imaging of breast: Secondary | ICD-10-CM | POA: Insufficient documentation

## 2016-06-30 ENCOUNTER — Other Ambulatory Visit: Payer: Self-pay | Admitting: Adult Health

## 2016-06-30 DIAGNOSIS — R928 Other abnormal and inconclusive findings on diagnostic imaging of breast: Secondary | ICD-10-CM

## 2016-07-03 ENCOUNTER — Ambulatory Visit
Admission: RE | Admit: 2016-07-03 | Discharge: 2016-07-03 | Disposition: A | Discharge status: Home / Self Care | Payer: 59 | Source: Ambulatory Visit | Attending: Adult Health | Admitting: Adult Health

## 2016-07-03 DIAGNOSIS — R928 Other abnormal and inconclusive findings on diagnostic imaging of breast: Secondary | ICD-10-CM

## 2017-04-27 ENCOUNTER — Other Ambulatory Visit: Payer: Self-pay | Admitting: Family Medicine

## 2017-04-27 DIAGNOSIS — Z1231 Encounter for screening mammogram for malignant neoplasm of breast: Secondary | ICD-10-CM

## 2017-07-06 ENCOUNTER — Ambulatory Visit
Admission: RE | Admit: 2017-07-06 | Discharge: 2017-07-06 | Disposition: A | Discharge status: Home / Self Care | Payer: 59 | Source: Ambulatory Visit | Attending: Family Medicine | Admitting: Family Medicine

## 2017-07-06 DIAGNOSIS — Z1231 Encounter for screening mammogram for malignant neoplasm of breast: Secondary | ICD-10-CM

## 2017-08-10 IMAGING — MG MM DIGITAL SCREENING BILAT W/ TOMO W/ CAD
8 of 13 series · 8 of 29 positions shown · non-contrast
Comparison: Previous exam(s).

CLINICAL DATA: Screening.

EXAM:
2D DIGITAL SCREENING BILATERAL MAMMOGRAM WITH CAD AND ADJUNCT TOMO

[L MLO synth-2D]
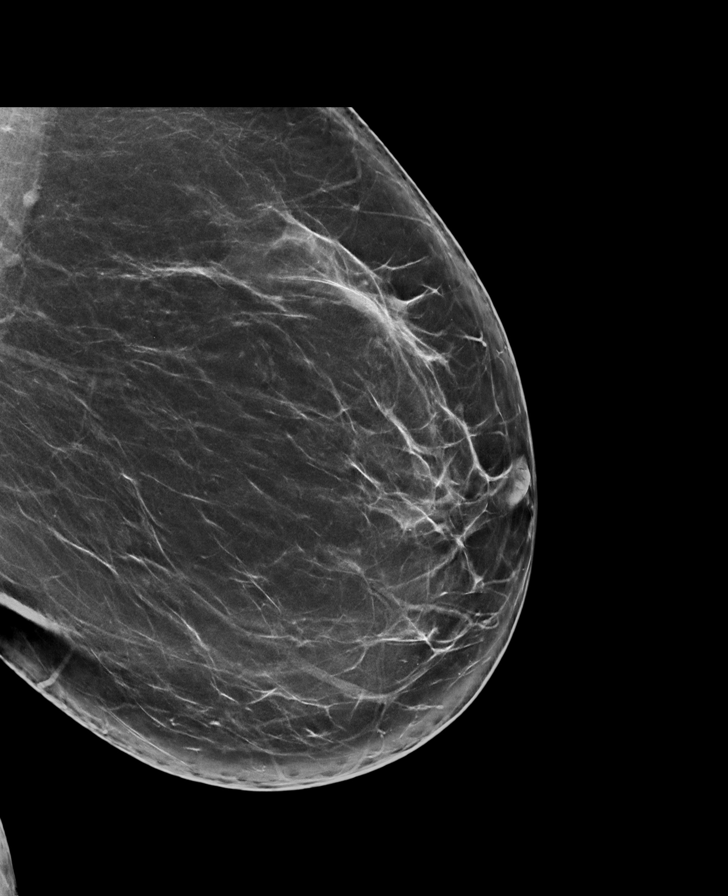

[L MLO]
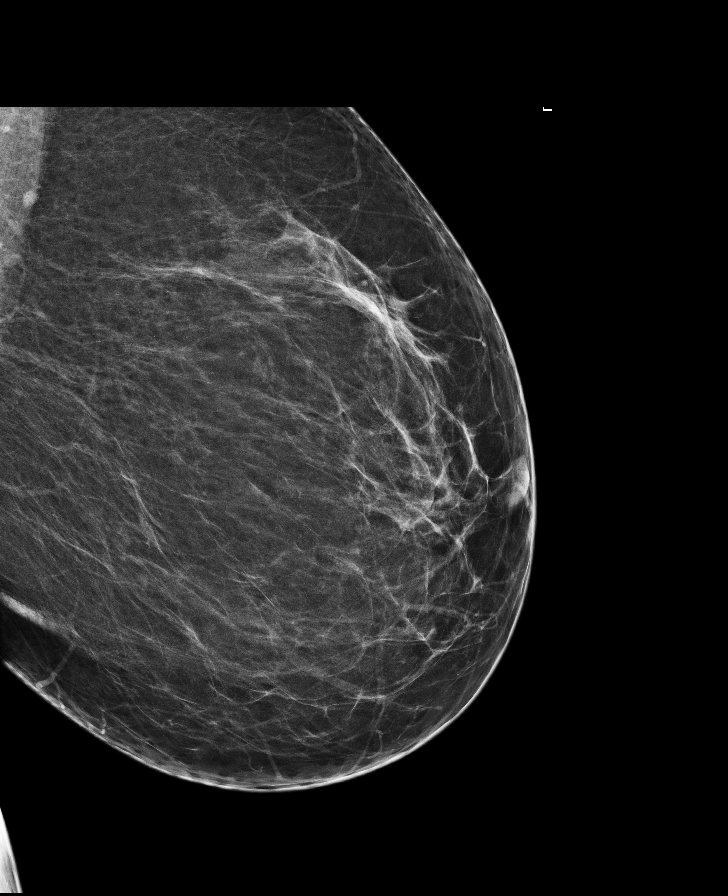

[R MLO synth-2D]
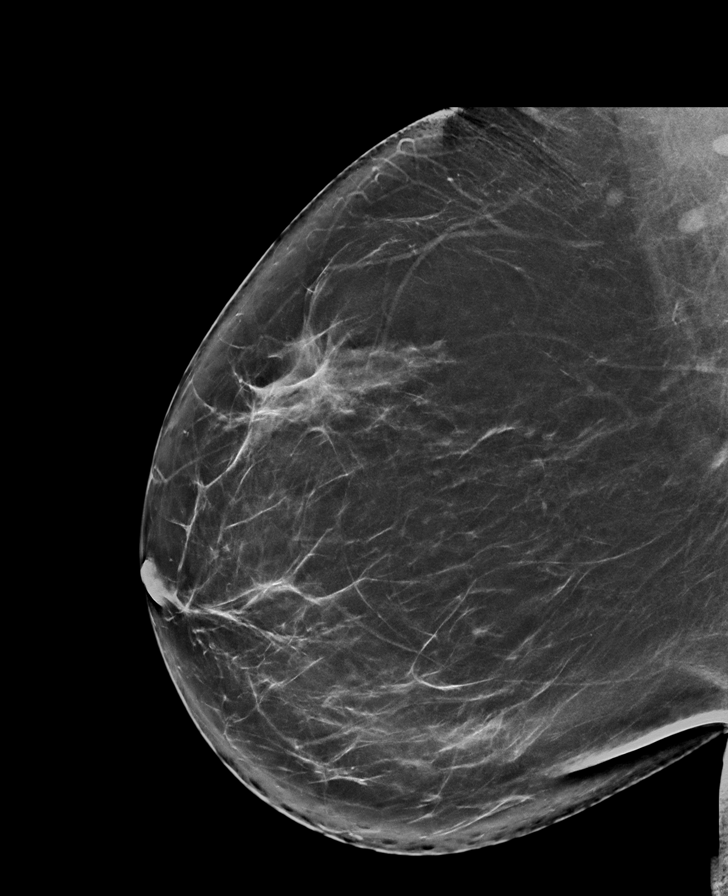

[L CC]
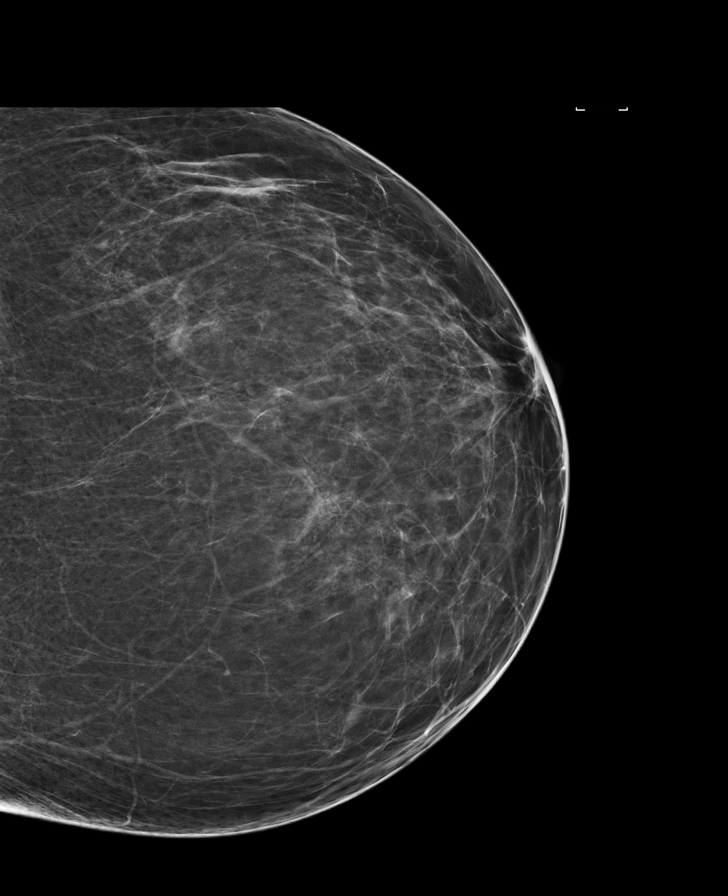

[R CC synth-2D]
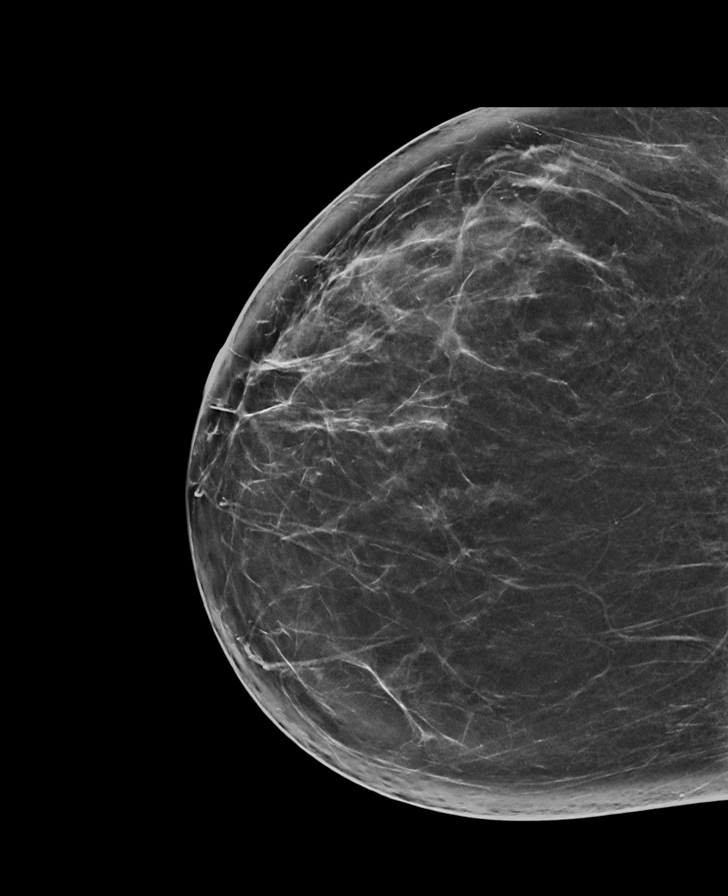

[L CC synth-2D]
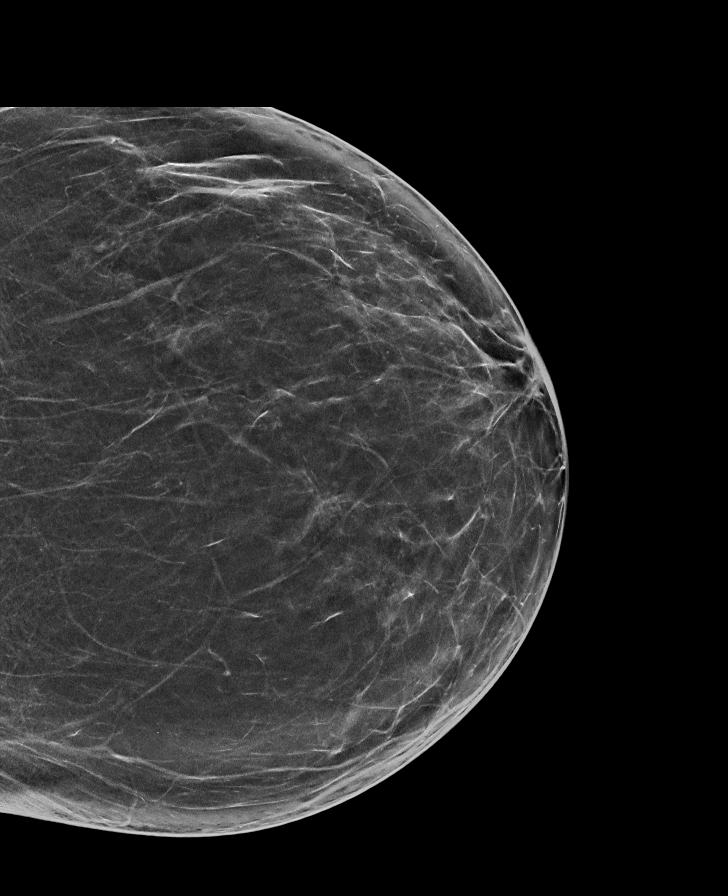

[R CC]
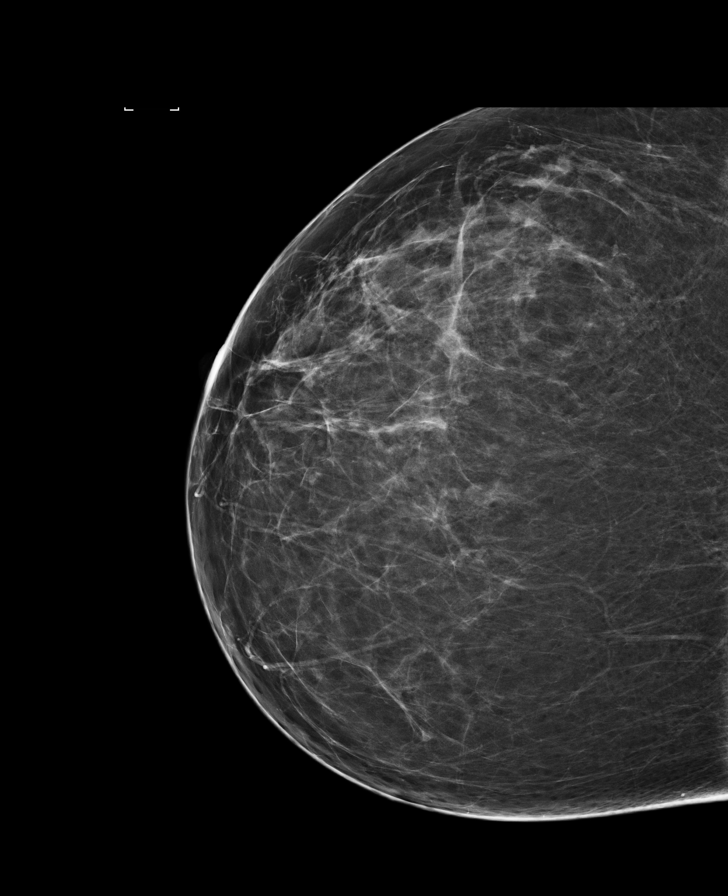

[R MLO]
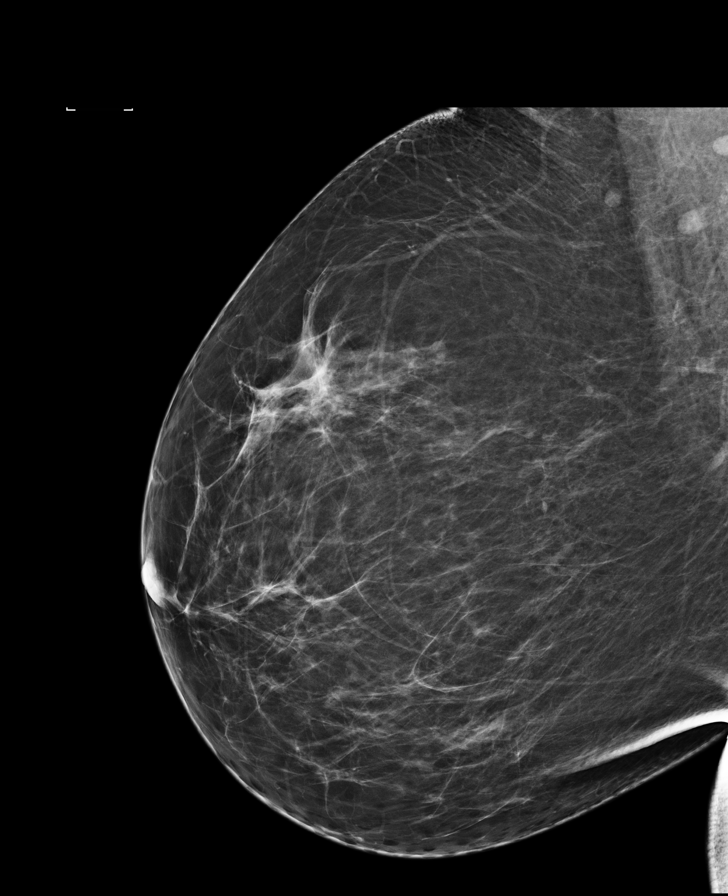

[8 of 29 positions shown; findings below may reference images not displayed]

ACR Breast Density Category b: There are scattered areas of
fibroglandular density.
FINDINGS: In the right breast, possible distortion warrants further
evaluation. In the left breast, no findings suspicious for
malignancy. Images were processed with CAD.
IMPRESSION: Further evaluation is suggested for possible distortion in the right
breast.

RECOMMENDATION:
Diagnostic mammogram and possibly ultrasound of the right breast.
(Code:U6-J-66C)

The patient will be contacted regarding the findings, and additional
imaging will be scheduled.

BI-RADS CATEGORY  0: Incomplete. Need additional imaging evaluation
and/or prior mammograms for comparison.

## 2017-08-18 IMAGING — MG MM DIGITAL DIAGNOSTIC UNILAT*R* W/ TOMO W/ CAD
6 series · 6 of 14 positions shown · non-contrast
Comparison: Previous exam(s).

CLINICAL DATA: Patient was called back from screening mammogram for
possible distortion in the right breast.

EXAM:
2D DIGITAL DIAGNOSTIC UNILATERAL RIGHT MAMMOGRAM WITH CAD AND
ADJUNCT TOMO

[R CC]
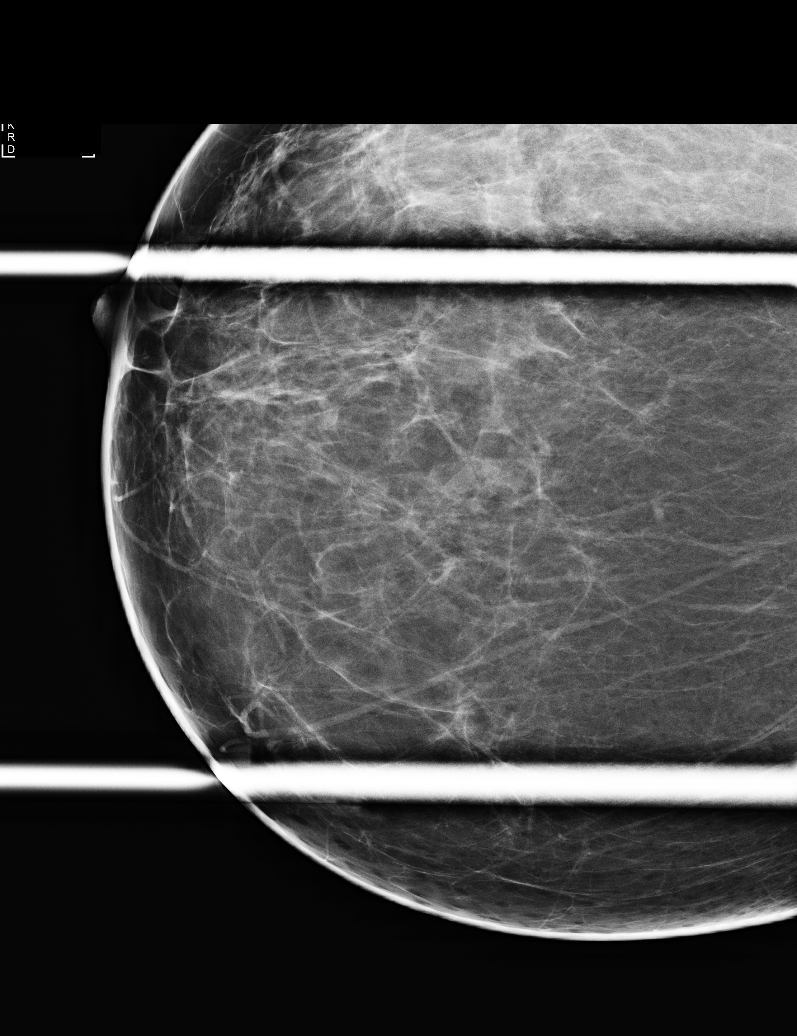

[R CC synth-2D]
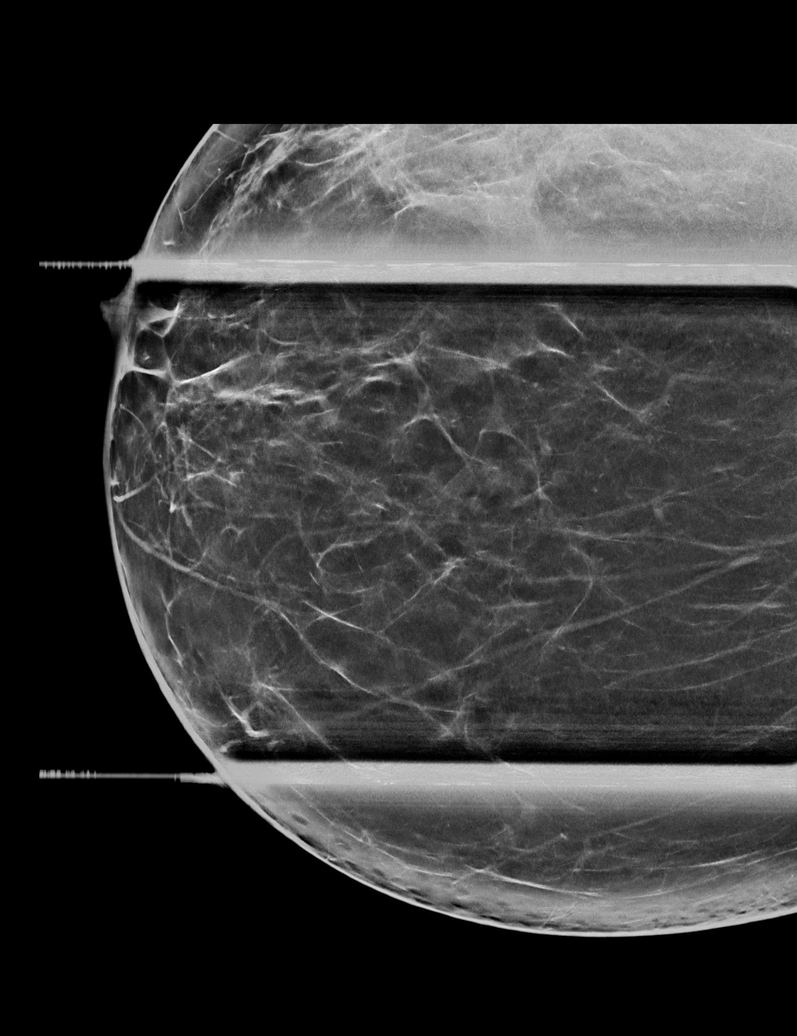

[R ML]
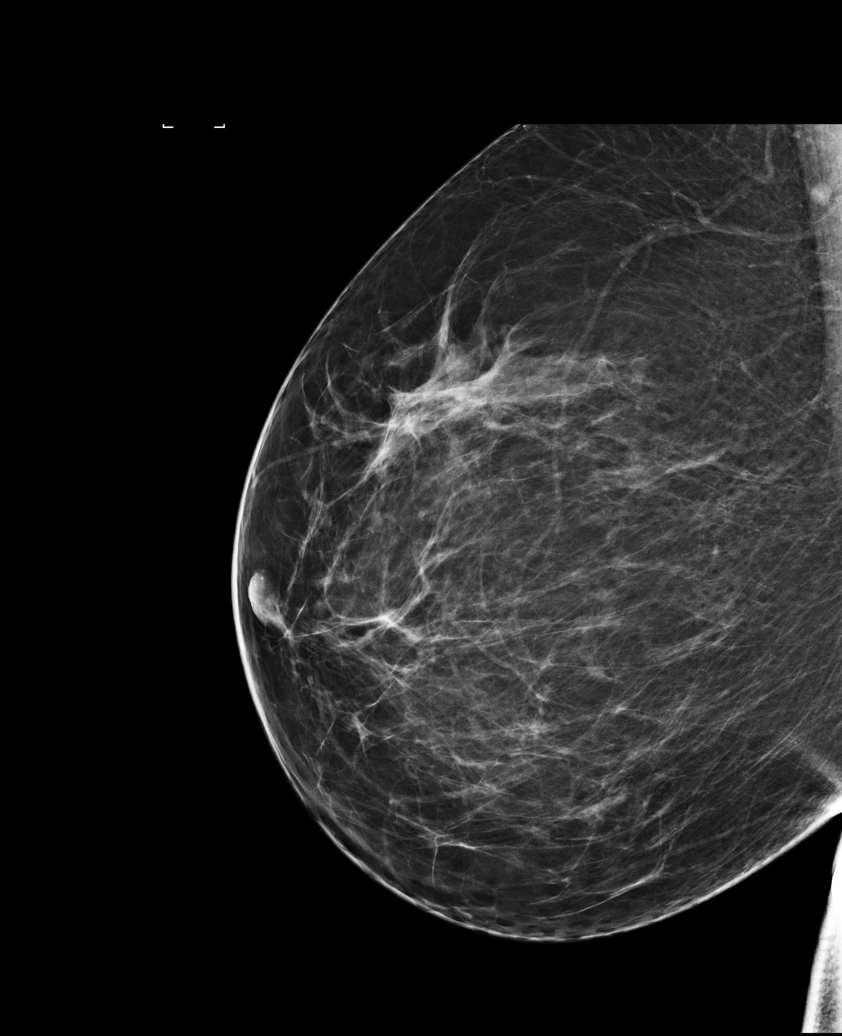

[R ML synth-2D]
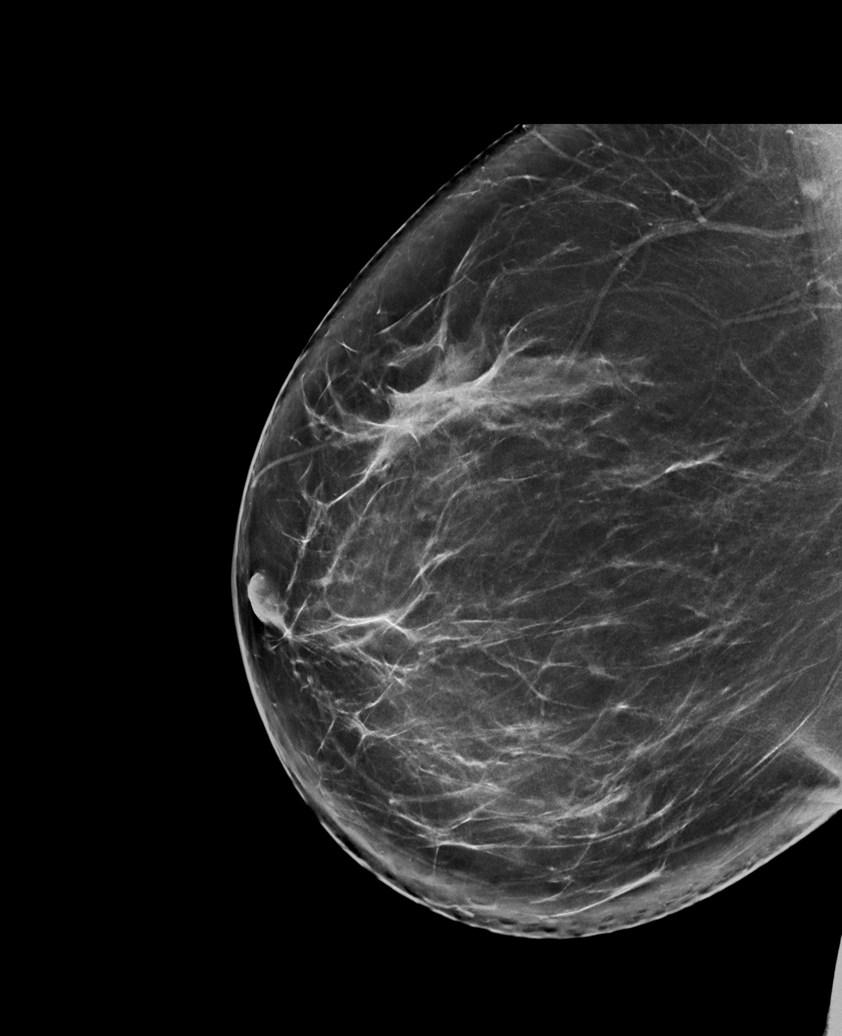

[R CC tomo · tomo slice 40/79.0]
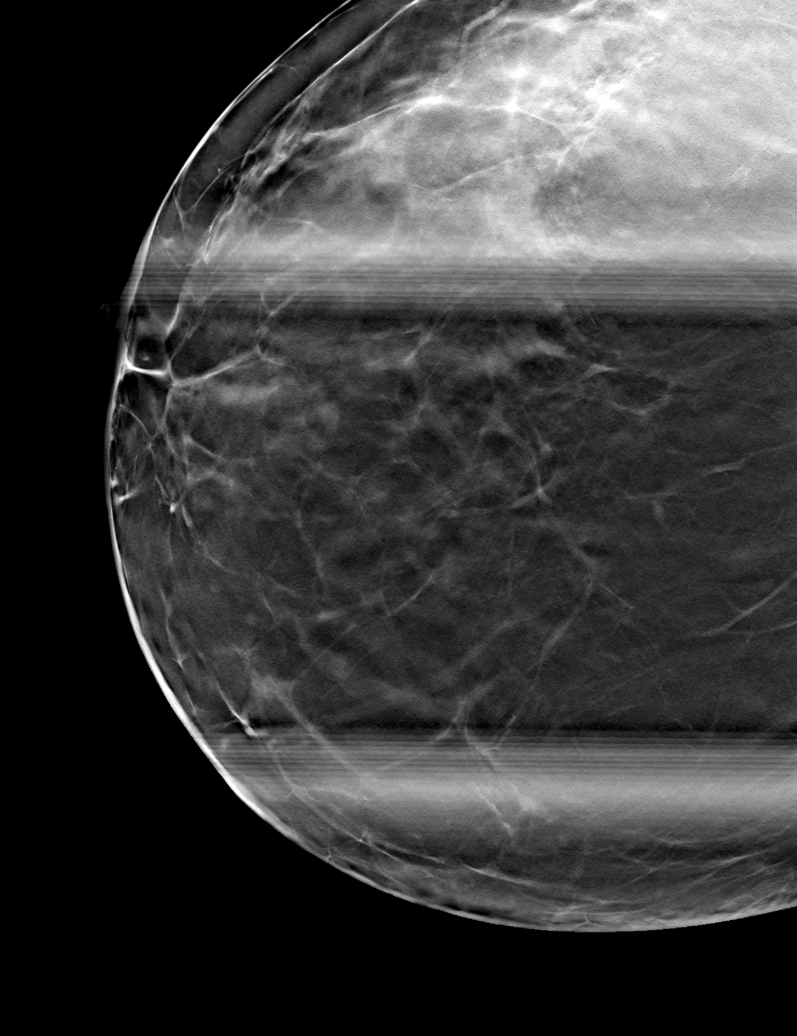

[R ML tomo · tomo slice 47/94.0]
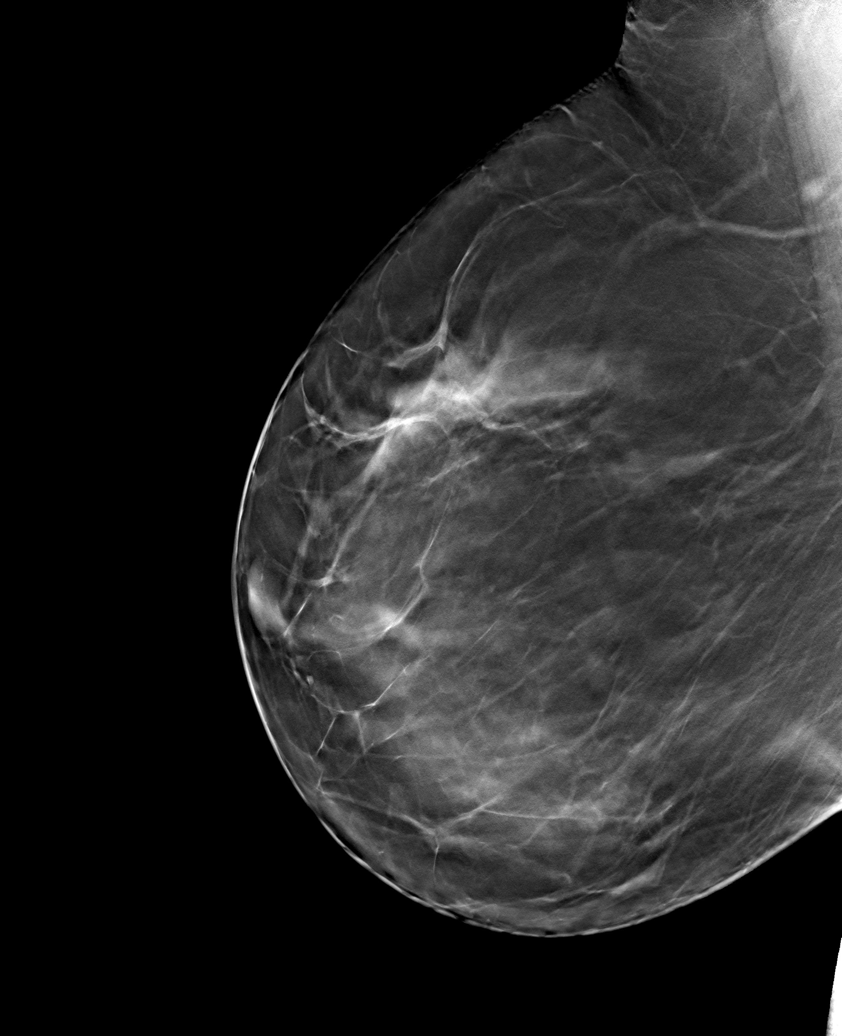

[6 of 14 positions shown; findings below may reference images not displayed]

ACR Breast Density Category b: There are scattered areas of
fibroglandular density.
FINDINGS: Additional imaging of the right breast was performed. Parenchymal
pattern is unchanged from prior exams. No distortion, mass or
malignant type microcalcifications identified.

Mammographic images were processed with CAD.
IMPRESSION: No evidence of malignancy in the right breast.

RECOMMENDATION:
Bilateral screening mammogram in Saturday May, 2017 is recommended.

I have discussed the findings and recommendations with the patient.
Results were also provided in writing at the conclusion of the
visit. If applicable, a reminder letter will be sent to the patient
regarding the next appointment.

BI-RADS CATEGORY  1: Negative.

## 2018-05-30 ENCOUNTER — Other Ambulatory Visit: Payer: Self-pay | Admitting: Internal Medicine

## 2018-05-30 ENCOUNTER — Other Ambulatory Visit: Payer: Self-pay | Admitting: Student

## 2018-05-30 DIAGNOSIS — Z1231 Encounter for screening mammogram for malignant neoplasm of breast: Secondary | ICD-10-CM

## 2018-07-26 ENCOUNTER — Ambulatory Visit
Admission: RE | Admit: 2018-07-26 | Discharge: 2018-07-26 | Disposition: A | Discharge status: Home / Self Care | Payer: Managed Care, Other (non HMO) | Source: Ambulatory Visit | Attending: Student | Admitting: Student

## 2018-07-26 DIAGNOSIS — Z1231 Encounter for screening mammogram for malignant neoplasm of breast: Secondary | ICD-10-CM | POA: Diagnosis present

## 2018-08-21 IMAGING — MG MM DIGITAL SCREENING BILAT W/ TOMO W/ CAD
8 of 12 series · 8 of 28 positions shown · non-contrast
Comparison: Previous exam(s).

CLINICAL DATA: Screening.

EXAM:
2D DIGITAL SCREENING BILATERAL MAMMOGRAM WITH CAD AND ADJUNCT TOMO

[L MLO synth-2D]
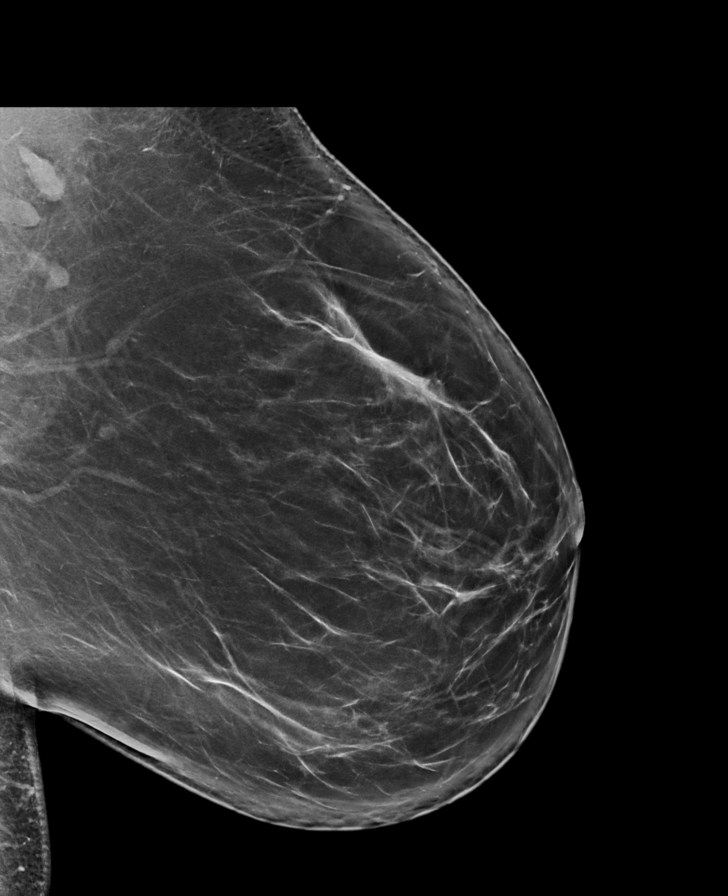

[R MLO synth-2D]
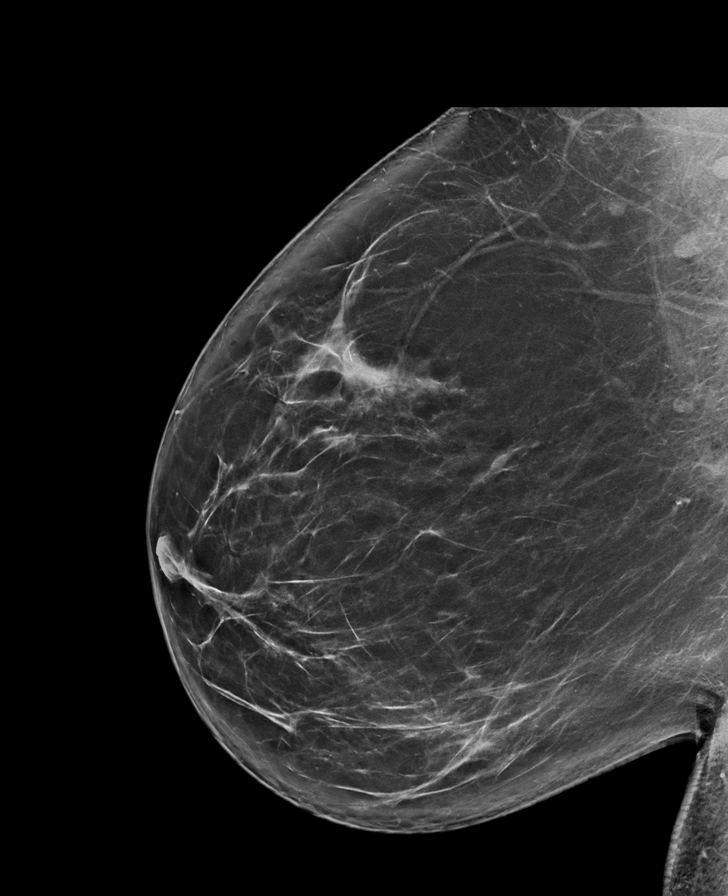

[R CC synth-2D]
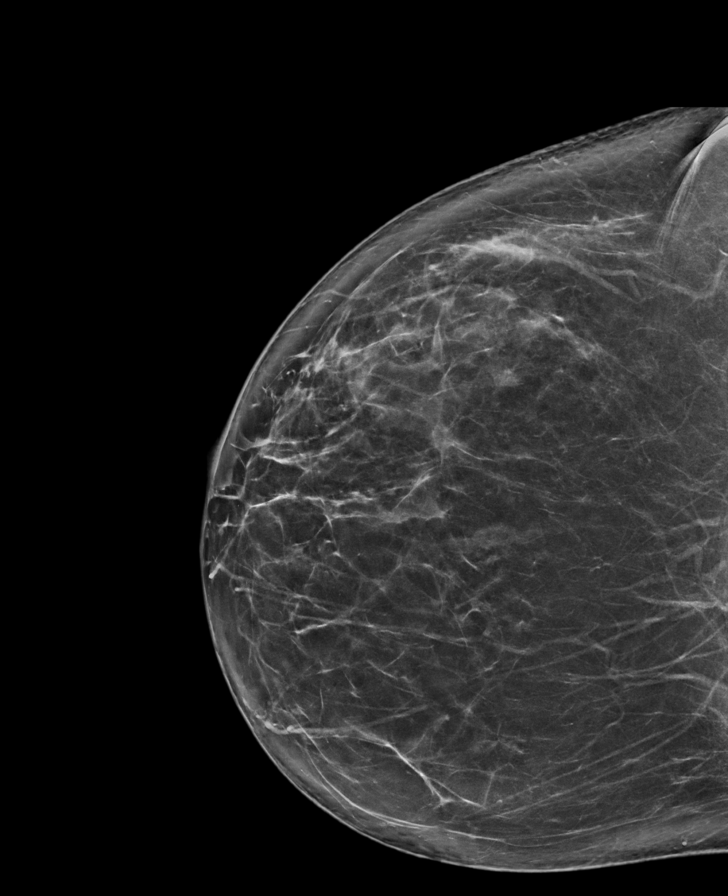

[L CC synth-2D]
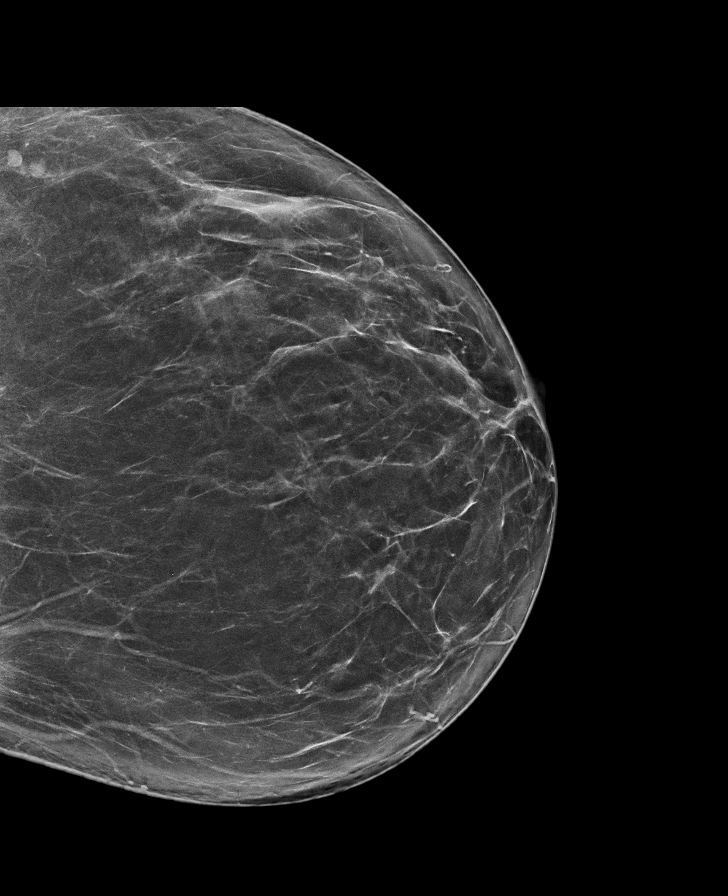

[R MLO]
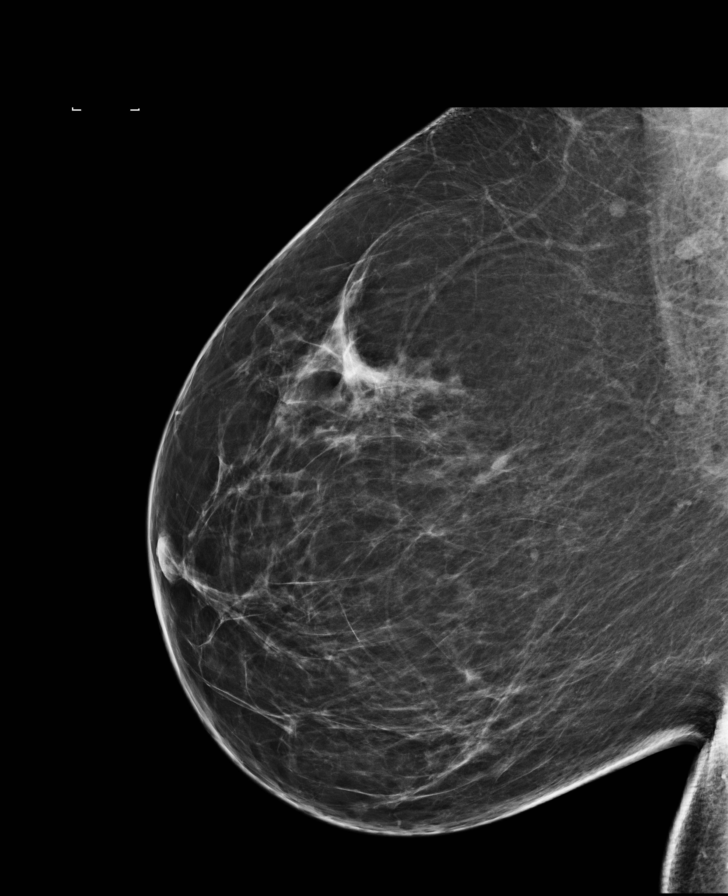

[L CC]
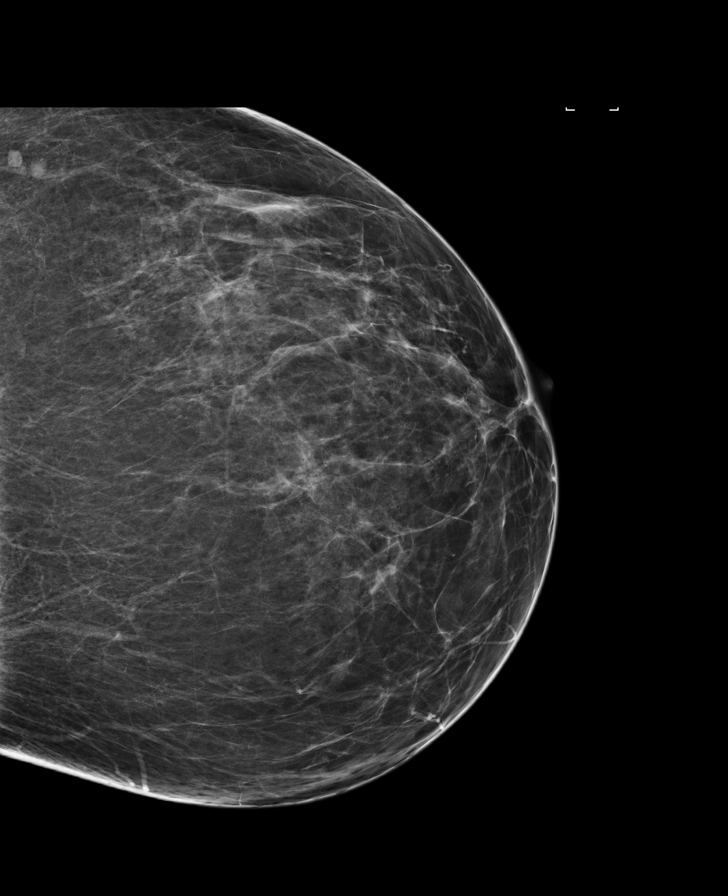

[R CC]
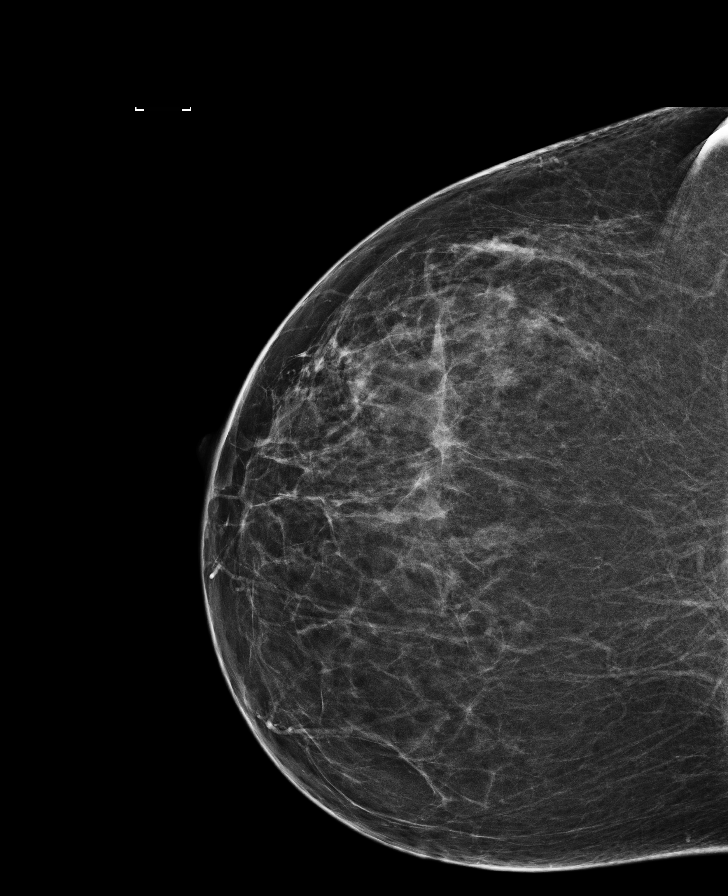

[L MLO]
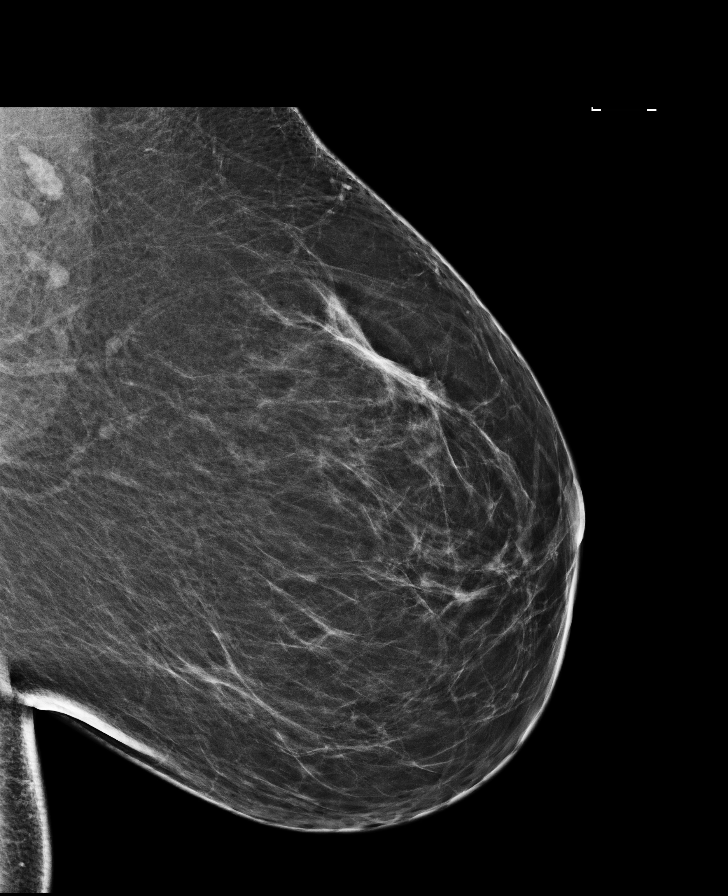

[8 of 28 positions shown; findings below may reference images not displayed]

ACR Breast Density Category b: There are scattered areas of
fibroglandular density.
FINDINGS: There are no findings suspicious for malignancy. Images were
processed with CAD.
IMPRESSION: No mammographic evidence of malignancy. A result letter of this
screening mammogram will be mailed directly to the patient.

RECOMMENDATION:
Screening mammogram in one year. (Code:[id number])

BI-RADS CATEGORY  1: Negative.

## 2019-06-16 ENCOUNTER — Other Ambulatory Visit: Payer: Self-pay | Admitting: Family Medicine

## 2019-06-16 DIAGNOSIS — Z1231 Encounter for screening mammogram for malignant neoplasm of breast: Secondary | ICD-10-CM

## 2019-08-21 ENCOUNTER — Ambulatory Visit
Admission: RE | Admit: 2019-08-21 | Discharge: 2019-08-21 | Disposition: A | Discharge status: Home / Self Care | Payer: Managed Care, Other (non HMO) | Source: Ambulatory Visit | Attending: Family Medicine | Admitting: Family Medicine

## 2019-08-21 DIAGNOSIS — Z1231 Encounter for screening mammogram for malignant neoplasm of breast: Secondary | ICD-10-CM | POA: Diagnosis not present

## 2019-09-10 IMAGING — MG DIGITAL SCREENING BILATERAL MAMMOGRAM WITH TOMO AND CAD
8 series · 8 of 24 positions shown · non-contrast
Comparison: Previous exam(s).

CLINICAL DATA: Screening.

EXAM:
DIGITAL SCREENING BILATERAL MAMMOGRAM WITH TOMO AND CAD

[L MLO synth-2D]
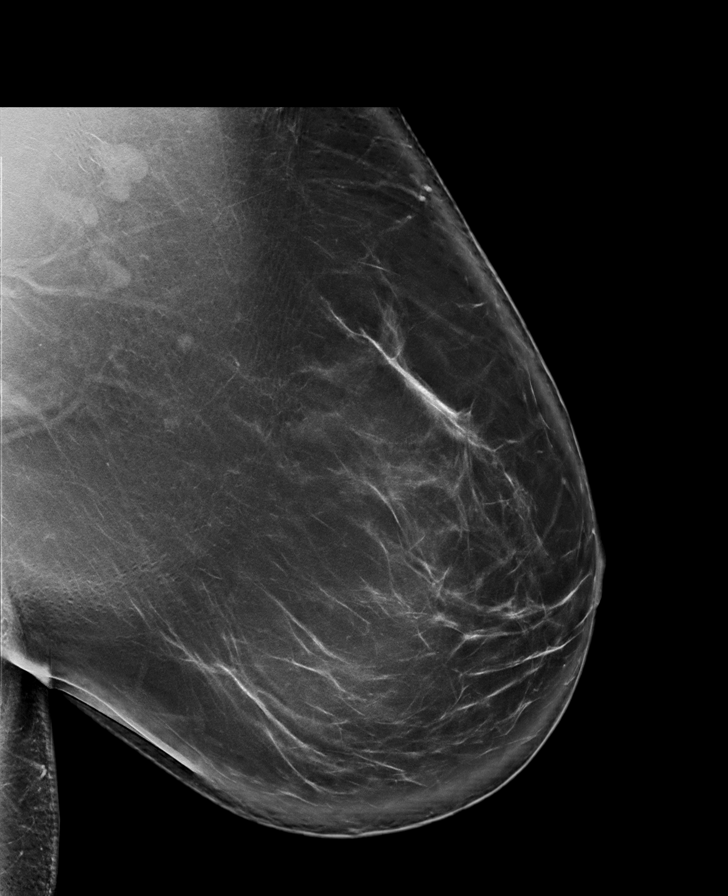

[L CC synth-2D]
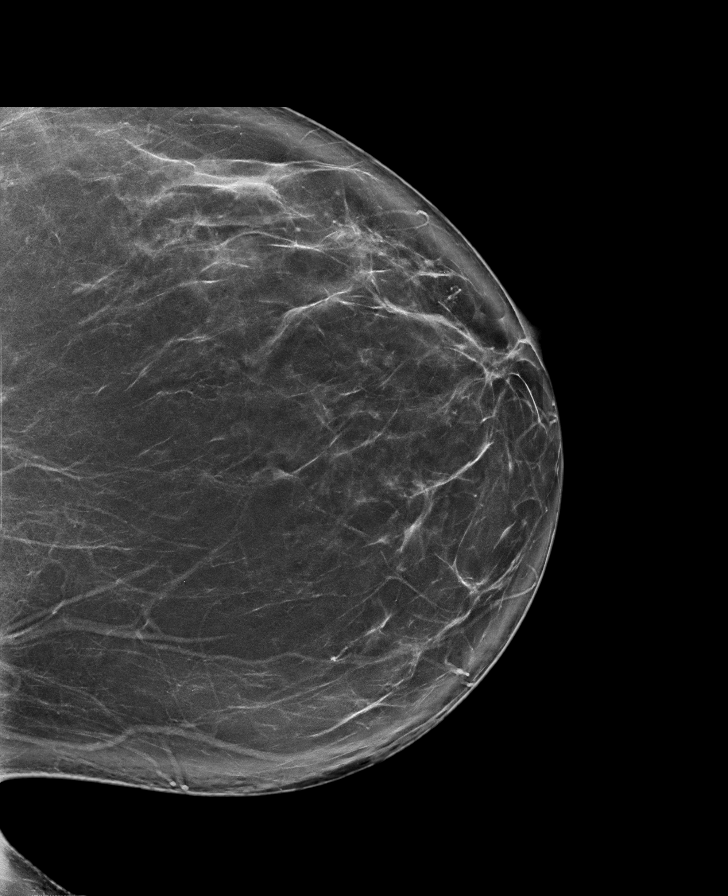

[R MLO synth-2D]
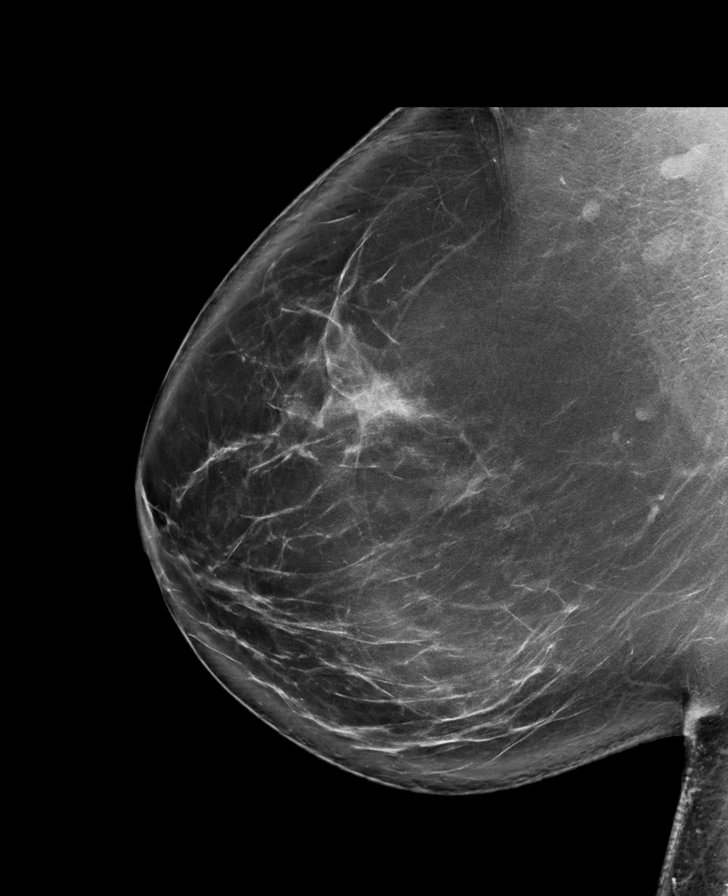

[R CC synth-2D]
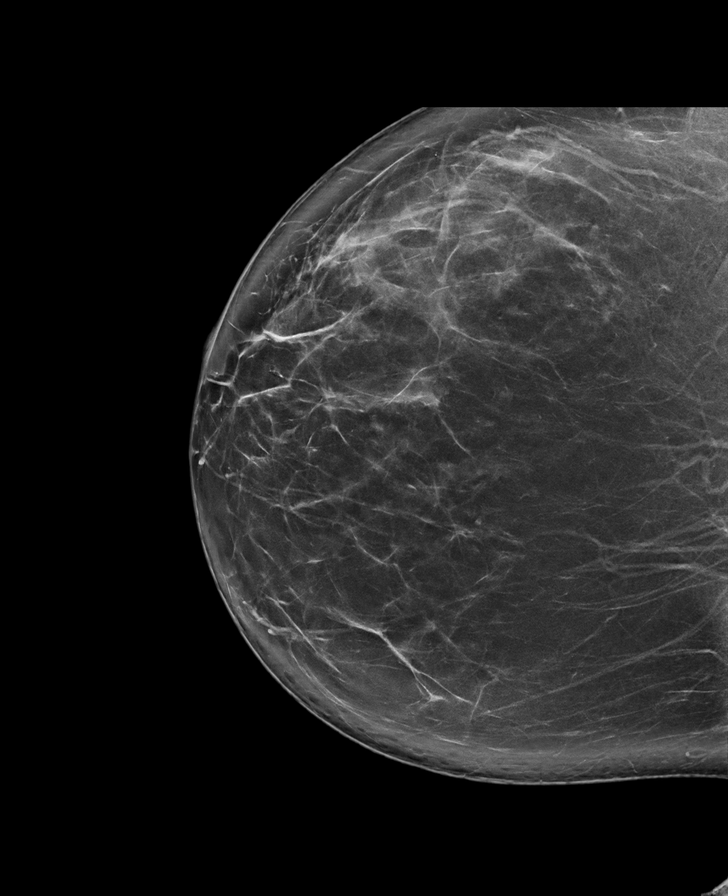

[L CC tomo · tomo slice 48/95.0]
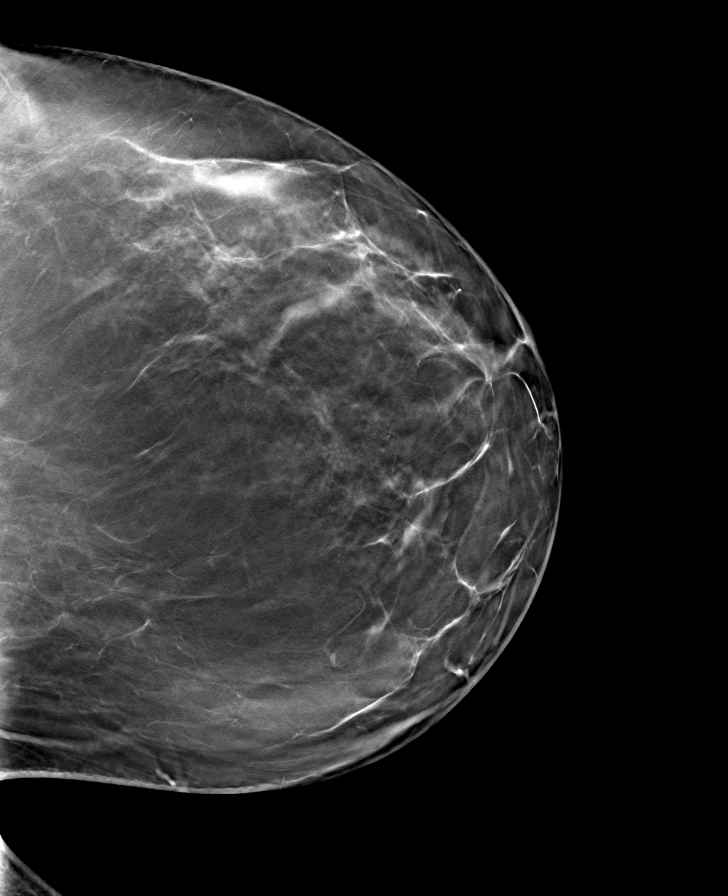

[R MLO tomo · tomo slice 53/106.0]
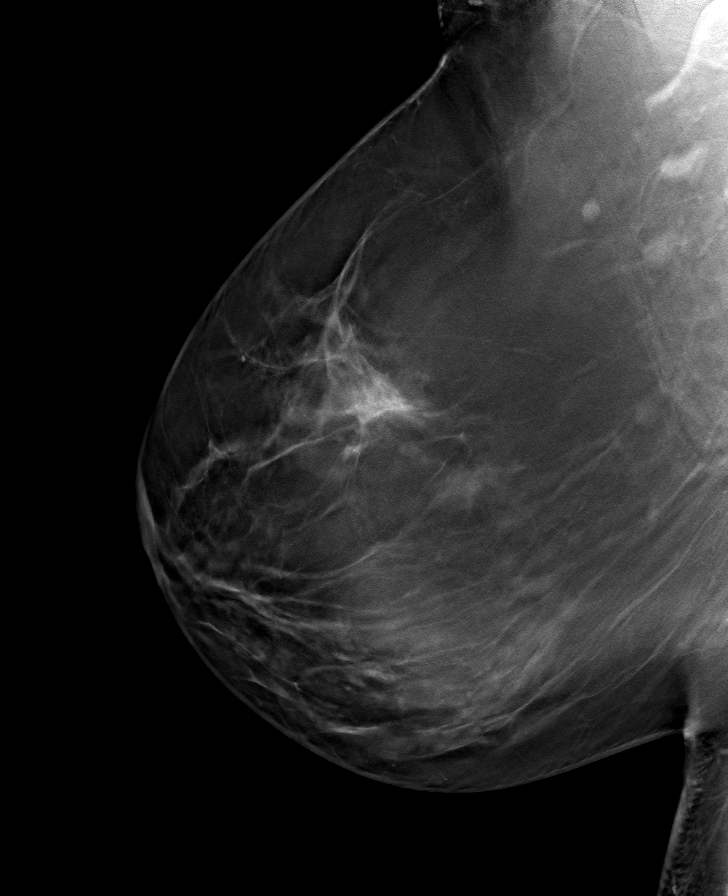

[L MLO tomo · tomo slice 61/122.0]
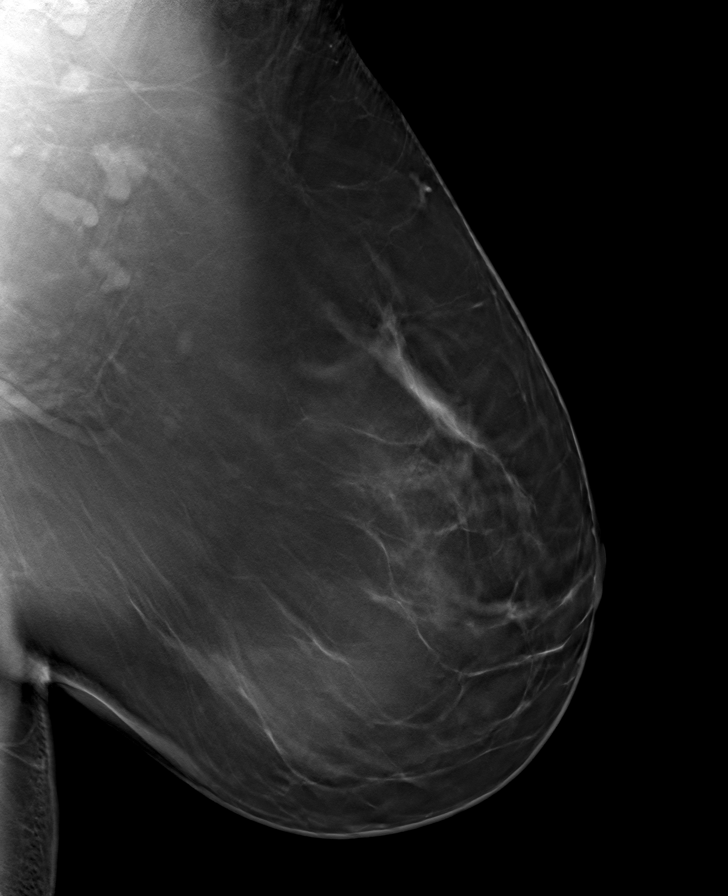

[R CC tomo · tomo slice 47/94.0]
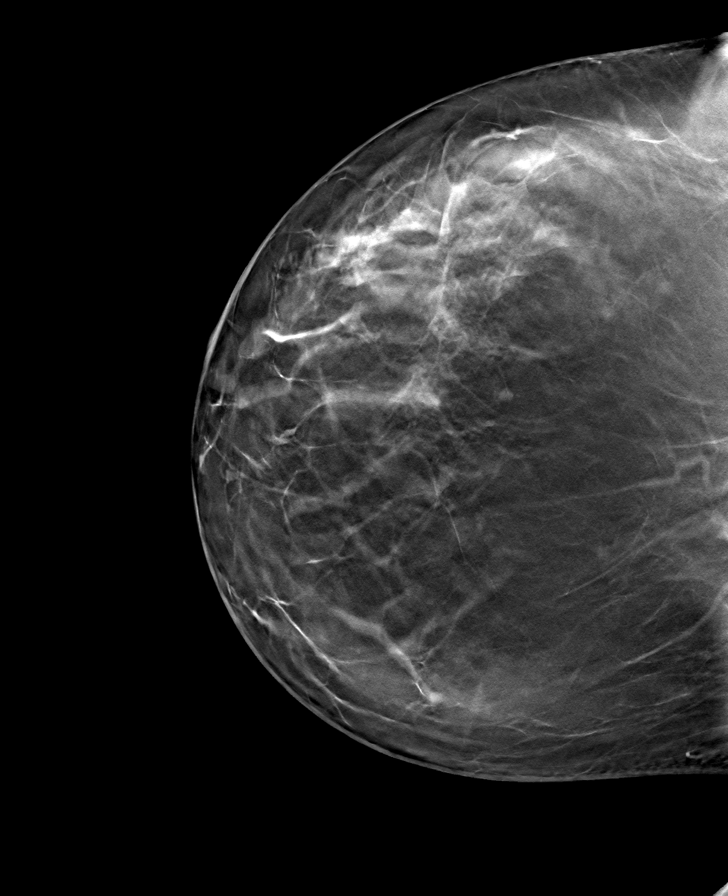

[8 of 24 positions shown; findings below may reference images not displayed]

ACR Breast Density Category b: There are scattered areas of
fibroglandular density.
FINDINGS: There are no findings suspicious for malignancy. Images were
processed with CAD.
IMPRESSION: No mammographic evidence of malignancy. A result letter of this
screening mammogram will be mailed directly to the patient.

RECOMMENDATION:
Screening mammogram in one year. (Code:[id number])

BI-RADS CATEGORY  1: Negative.

## 2019-09-29 ENCOUNTER — Ambulatory Visit: Payer: Managed Care, Other (non HMO) | Attending: Student

## 2019-09-29 DIAGNOSIS — Z23 Encounter for immunization: Secondary | ICD-10-CM

## 2019-09-29 NOTE — Progress Notes (Signed)
   Covid-19 Vaccination Clinic  Name:  Christina Brennan    MRN: [id number] DOB: 09-03-1965  09/29/2019  [name] was observed post Covid-19 immunization for 15 minutes without incident. She was provided with Vaccine Information Sheet and instruction to access the V-Safe system.   [name] was instructed to call 911 with any severe reactions post vaccine: Marland Kitchen Difficulty breathing  . Swelling of face and throat  . A fast heartbeat  . A bad rash all over body  . Dizziness and weakness   Immunizations Administered    Name Date Dose VIS Date Route   Pfizer COVID-19 Vaccine 09/29/2019  6:31 PM 0.3 mL 07/07/2019 Intramuscular   Manufacturer: [employer], [employer]   Lot: [id number]   NDC: [address]-3

## 2019-11-01 ENCOUNTER — Ambulatory Visit: Payer: Managed Care, Other (non HMO) | Attending: Internal Medicine

## 2019-11-01 DIAGNOSIS — Z23 Encounter for immunization: Secondary | ICD-10-CM

## 2019-11-01 NOTE — Progress Notes (Signed)
   Covid-19 Vaccination Clinic  Name:  Alexarae Oliva    MRN: [id number] DOB: [birth year]  11/01/2019  Ms. Granlund was observed post Covid-19 immunization for 15 minutes without incident. She was provided with Vaccine Information Sheet and instruction to access the V-Safe system.   Ms. Hampshire was instructed to call 911 with any severe reactions post vaccine: Marland Kitchen Difficulty breathing  . Swelling of face and throat  . A fast heartbeat  . A bad rash all over body  . Dizziness and weakness   Immunizations Administered    Name Date Dose VIS Date Route   Pfizer COVID-19 Vaccine 11/01/2019  4:33 PM 0.3 mL 07/07/2019 Intramuscular   Manufacturer: [employer], [employer]   Lot: [id number]   NDC: [address]-5

## 2020-07-24 ENCOUNTER — Other Ambulatory Visit: Payer: Self-pay | Admitting: Student

## 2020-07-24 DIAGNOSIS — Z1231 Encounter for screening mammogram for malignant neoplasm of breast: Secondary | ICD-10-CM

## 2020-08-26 ENCOUNTER — Ambulatory Visit
Admission: RE | Admit: 2020-08-26 | Discharge: 2020-08-26 | Disposition: A | Discharge status: Home / Self Care | Payer: Managed Care, Other (non HMO) | Source: Ambulatory Visit | Attending: Student | Admitting: Student

## 2020-08-26 ENCOUNTER — Other Ambulatory Visit: Payer: Self-pay

## 2020-08-26 DIAGNOSIS — Z1231 Encounter for screening mammogram for malignant neoplasm of breast: Secondary | ICD-10-CM | POA: Diagnosis present

## 2020-10-05 IMAGING — MG DIGITAL SCREENING BILAT W/ TOMO W/ CAD
6 of 10 series · 6 of 30 positions shown · non-contrast
Comparison: Previous exam(s).

CLINICAL DATA: Screening.

EXAM:
DIGITAL SCREENING BILATERAL MAMMOGRAM WITH TOMO AND CAD

[L MLO synth-2D (1 of 2)]
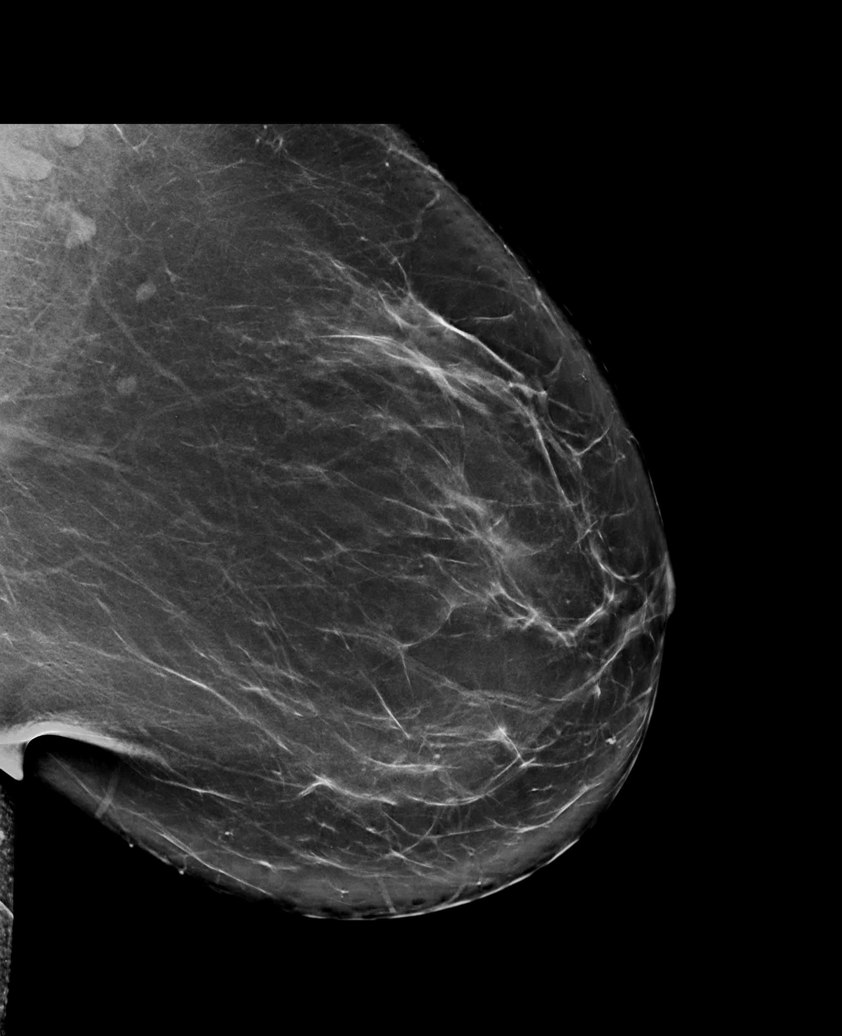

[L MLO synth-2D (2 of 2)]
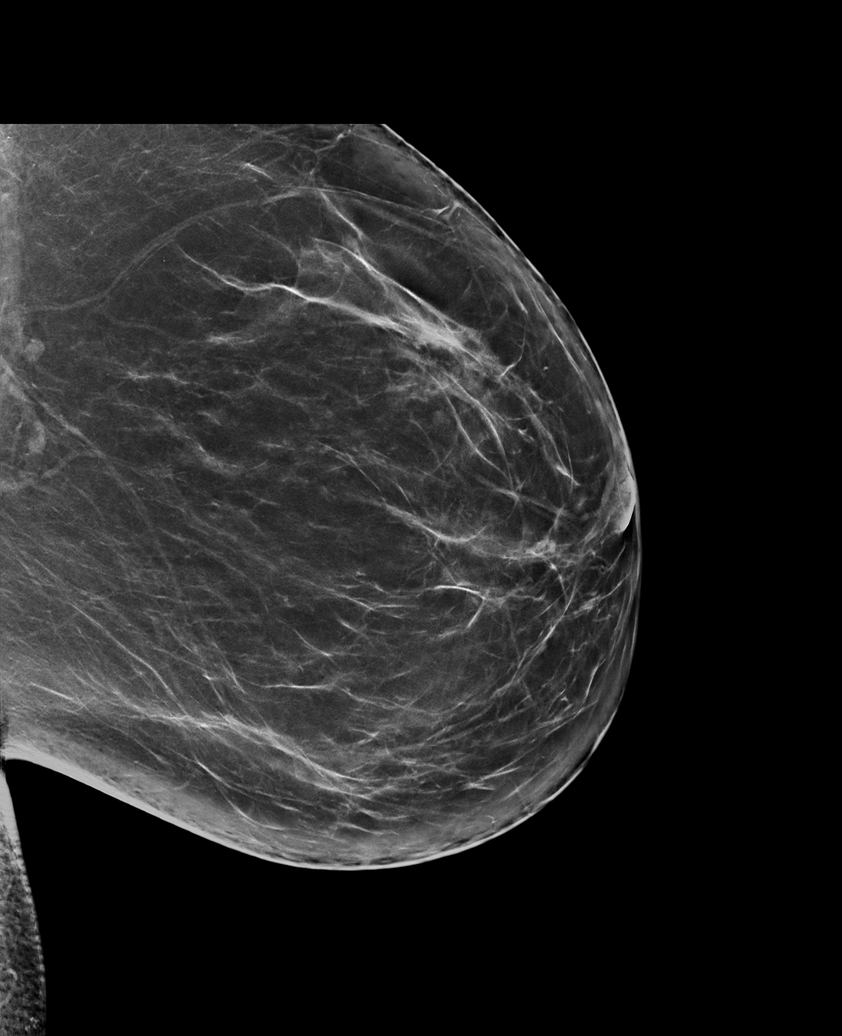

[L CC synth-2D]
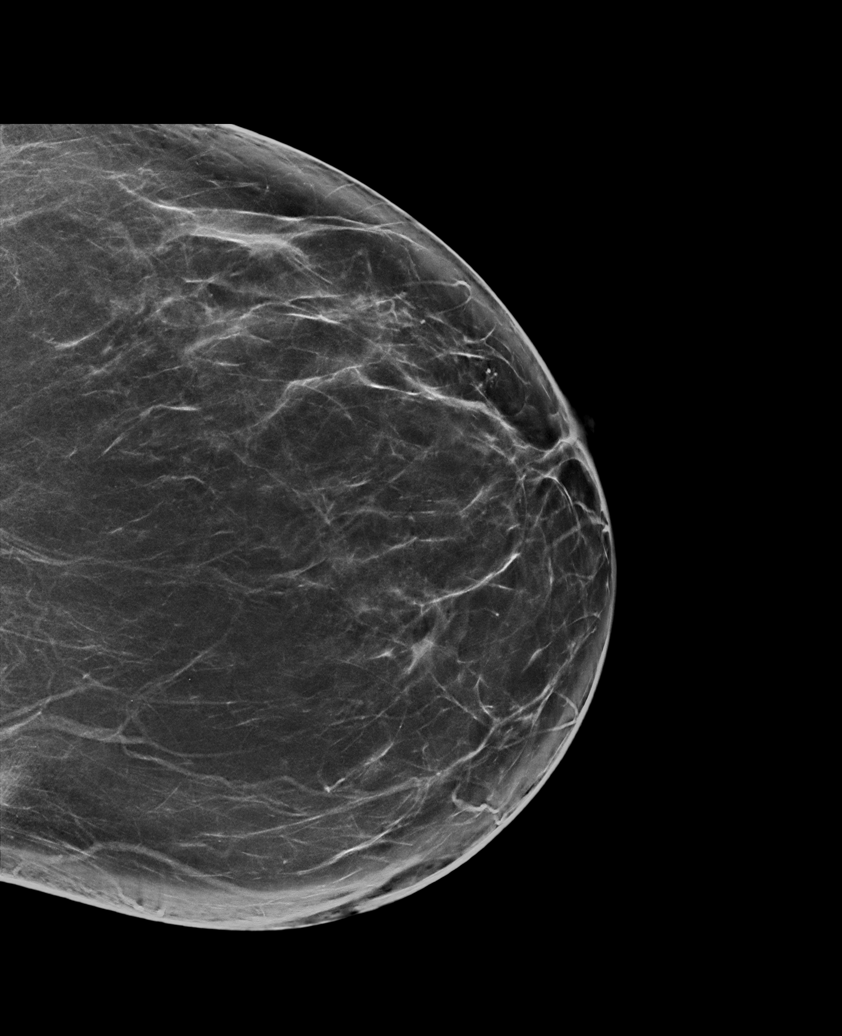

[R CC synth-2D]
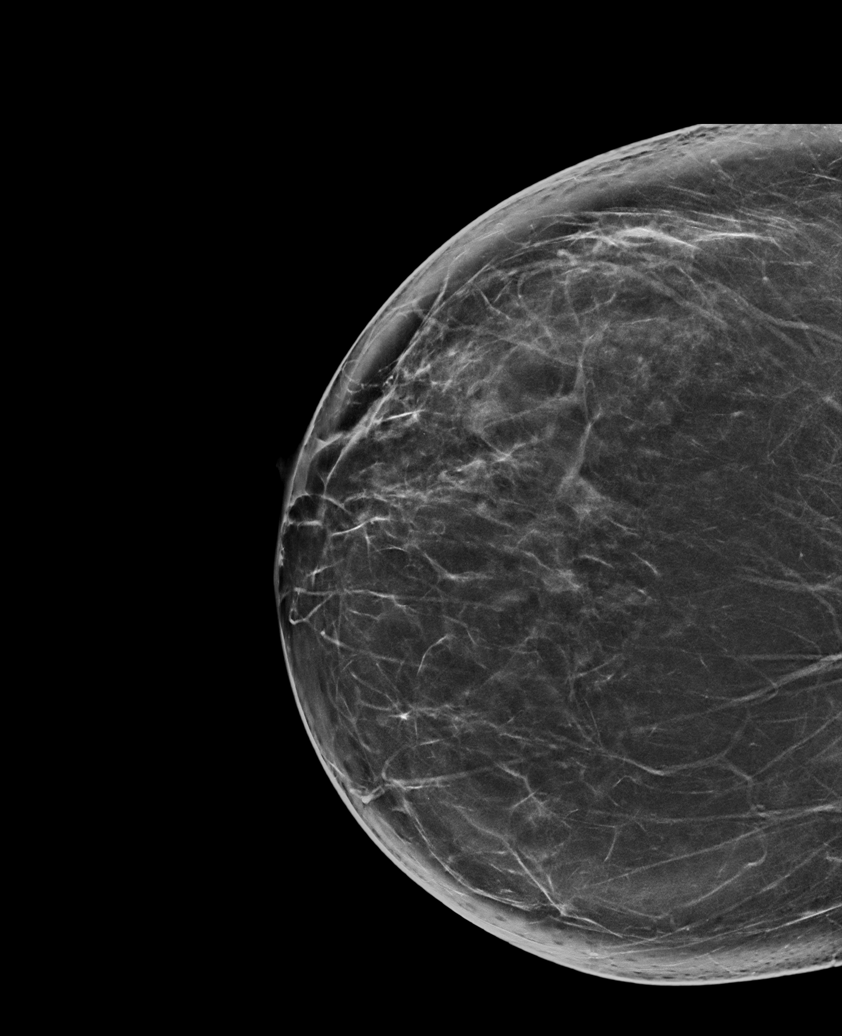

[R MLO synth-2D]
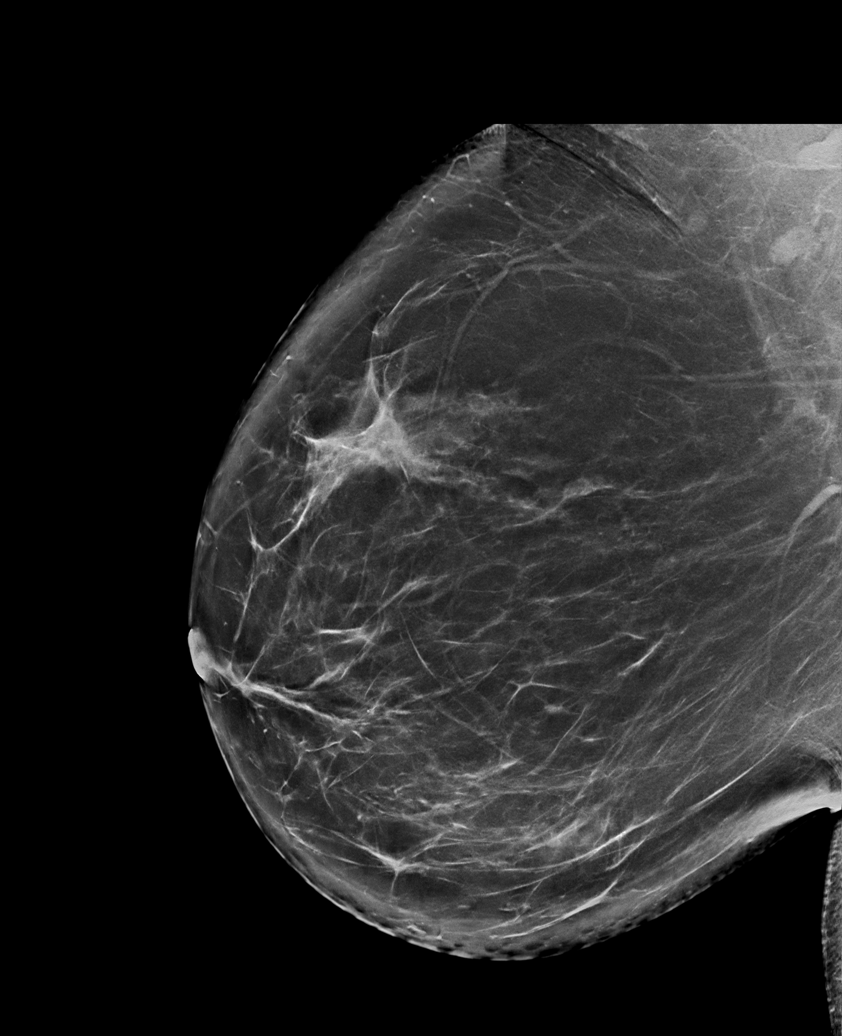

[L MLO tomo · tomo slice 47/94.0]
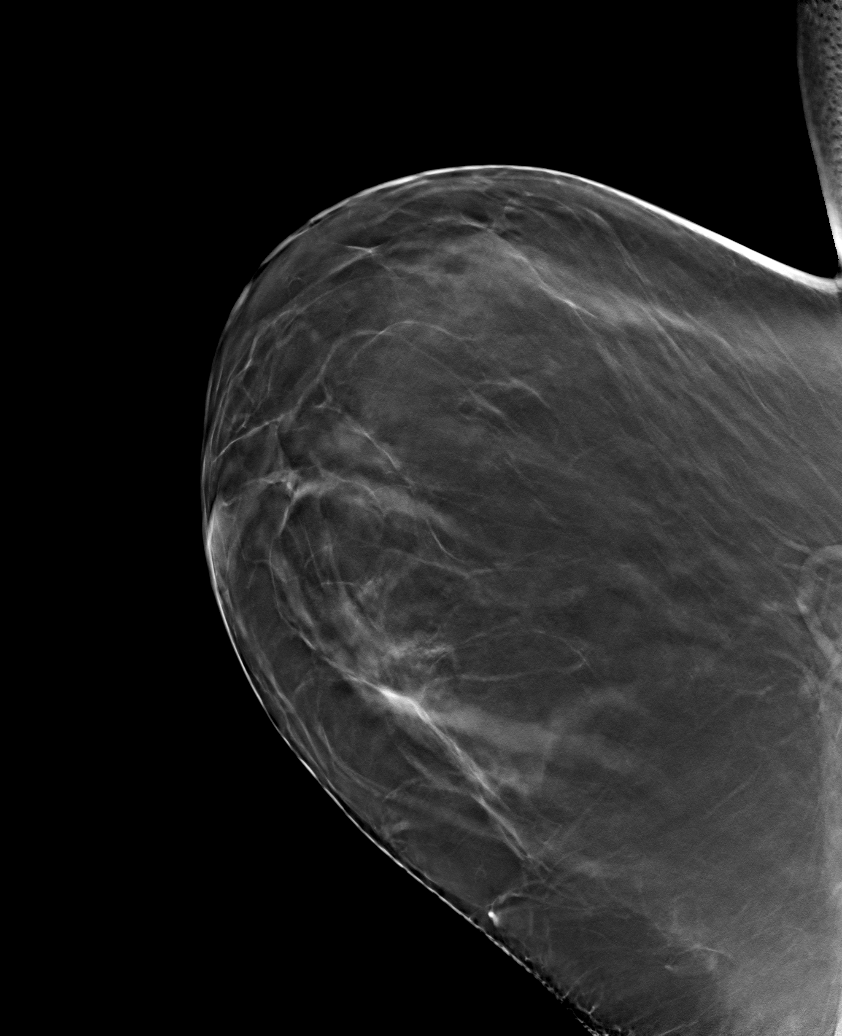

[6 of 30 positions shown; findings below may reference images not displayed]

ACR Breast Density Category b: There are scattered areas of
fibroglandular density.
FINDINGS: There are no findings suspicious for malignancy. Images were
processed with CAD.
IMPRESSION: No mammographic evidence of malignancy. A result letter of this
screening mammogram will be mailed directly to the patient.

RECOMMENDATION:
Screening mammogram in one year. (Code:[id number])

BI-RADS CATEGORY  1: Negative.

## 2021-07-10 DIAGNOSIS — Z0289 Encounter for other administrative examinations: Secondary | ICD-10-CM

## 2021-08-07 ENCOUNTER — Encounter (INDEPENDENT_AMBULATORY_CARE_PROVIDER_SITE_OTHER): Payer: Self-pay | Admitting: Family Medicine

## 2021-08-07 ENCOUNTER — Ambulatory Visit (INDEPENDENT_AMBULATORY_CARE_PROVIDER_SITE_OTHER): Payer: Managed Care, Other (non HMO) | Admitting: Family Medicine

## 2021-08-07 ENCOUNTER — Other Ambulatory Visit: Payer: Self-pay

## 2021-08-07 VITALS — BP 115/73 | HR 69 | Temp 97.9°F | Ht 64.0 in | Wt 224.0 lb

## 2021-08-07 DIAGNOSIS — F32A Depression, unspecified: Secondary | ICD-10-CM | POA: Diagnosis not present

## 2021-08-07 DIAGNOSIS — F419 Anxiety disorder, unspecified: Secondary | ICD-10-CM

## 2021-08-07 DIAGNOSIS — E669 Obesity, unspecified: Secondary | ICD-10-CM | POA: Diagnosis not present

## 2021-08-07 DIAGNOSIS — E039 Hypothyroidism, unspecified: Secondary | ICD-10-CM | POA: Diagnosis not present

## 2021-08-07 DIAGNOSIS — E1165 Type 2 diabetes mellitus with hyperglycemia: Secondary | ICD-10-CM | POA: Diagnosis not present

## 2021-08-07 DIAGNOSIS — R0602 Shortness of breath: Secondary | ICD-10-CM | POA: Diagnosis not present

## 2021-08-07 DIAGNOSIS — Z6838 Body mass index (BMI) 38.0-38.9, adult: Secondary | ICD-10-CM

## 2021-08-07 DIAGNOSIS — R5383 Other fatigue: Secondary | ICD-10-CM

## 2021-08-07 DIAGNOSIS — E559 Vitamin D deficiency, unspecified: Secondary | ICD-10-CM

## 2021-08-08 ENCOUNTER — Other Ambulatory Visit: Payer: Self-pay | Admitting: Family Medicine

## 2021-08-08 LAB — CBC WITH DIFFERENTIAL/PLATELET
Basophils Absolute: 0.1 10*3/uL (ref 0.0–0.2)
Basos: 1 %
EOS (ABSOLUTE): 0.2 10*3/uL (ref 0.0–0.4)
Eos: 4 %
Hematocrit: 41.3 % (ref 34.0–46.6)
Hemoglobin: 13.1 g/dL (ref 11.1–15.9)
Immature Grans (Abs): 0 10*3/uL (ref 0.0–0.1)
Immature Granulocytes: 0 %
Lymphocytes Absolute: 1.8 10*3/uL (ref 0.7–3.1)
Lymphs: 31 %
MCH: 26.7 pg (ref 26.6–33.0)
MCHC: 31.7 g/dL (ref 31.5–35.7)
MCV: 84 fL (ref 79–97)
Monocytes Absolute: 0.5 10*3/uL (ref 0.1–0.9)
Monocytes: 9 %
Neutrophils Absolute: 3.2 10*3/uL (ref 1.4–7.0)
Neutrophils: 55 %
Platelets: 296 10*3/uL (ref 150–450)
RBC: 4.9 x10E6/uL (ref 3.77–5.28)
RDW: 13.6 % (ref 11.7–15.4)
WBC: 5.8 10*3/uL (ref 3.4–10.8)

## 2021-08-08 LAB — LIPID PANEL WITH LDL/HDL RATIO
Cholesterol, Total: 224 mg/dL — ABNORMAL HIGH (ref 100–199)
HDL: 73 mg/dL (ref >39)
LDL Chol Calc (NIH): 134 mg/dL — ABNORMAL HIGH (ref 0–99)
LDL/HDL Ratio: 1.8 ratio (ref 0.0–3.2)
Triglycerides: 99 mg/dL (ref 0–149)
VLDL Cholesterol Cal: 17 mg/dL (ref 5–40)

## 2021-08-08 LAB — COMPREHENSIVE METABOLIC PANEL
ALT: 25 IU/L (ref 0–32)
AST: 16 IU/L (ref 0–40)
Albumin/Globulin Ratio: 1.8 (ref 1.2–2.2)
Albumin: 4.2 g/dL (ref 3.8–4.9)
Alkaline Phosphatase: 89 IU/L (ref 44–121)
BUN/Creatinine Ratio: 21 (ref 9–23)
BUN: 12 mg/dL (ref 6–24)
Bilirubin Total: 0.3 mg/dL (ref 0.0–1.2)
CO2: 24 mmol/L (ref 20–29)
Calcium: 8.9 mg/dL (ref 8.7–10.2)
Chloride: 103 mmol/L (ref 96–106)
Creatinine, Ser: 0.57 mg/dL (ref 0.57–1.00)
Globulin, Total: 2.4 g/dL (ref 1.5–4.5)
Glucose: 101 mg/dL — ABNORMAL HIGH (ref 70–99)
Potassium: 4.8 mmol/L (ref 3.5–5.2)
Sodium: 139 mmol/L (ref 134–144)
Total Protein: 6.6 g/dL (ref 6.0–8.5)
eGFR: 108 mL/min/{1.73_m2} (ref >59)

## 2021-08-08 LAB — T3: T3, Total: 123 ng/dL (ref 71–180)

## 2021-08-08 LAB — VITAMIN B12: Vitamin B-12: 475 pg/mL (ref 232–1245)

## 2021-08-08 LAB — FOLATE: Folate: 18.6 ng/mL (ref >3.0)

## 2021-08-08 LAB — INSULIN, RANDOM: INSULIN: 8.8 u[IU]/mL (ref 2.6–24.9)

## 2021-08-08 LAB — HEMOGLOBIN A1C
Est. average glucose Bld gHb Est-mCnc: 140 mg/dL
Hgb A1c MFr Bld: 6.5 % — ABNORMAL HIGH (ref 4.8–5.6)

## 2021-08-08 LAB — T4, FREE: Free T4: 1.02 ng/dL (ref 0.82–1.77)

## 2021-08-08 LAB — VITAMIN D 25 HYDROXY (VIT D DEFICIENCY, FRACTURES): Vit D, 25-Hydroxy: 40.1 ng/mL (ref 30.0–100.0)

## 2021-08-08 LAB — TSH: TSH: 1.93 u[IU]/mL (ref 0.450–4.500)

## 2021-08-11 NOTE — Progress Notes (Signed)
Dear Christina Amble, PA,   Thank you for referring Christina Brennan to our clinic. The following note includes my evaluation and treatment recommendations.  Chief Complaint:   OBESITY Christina Brennan (MR# [id number]) is a 56 y.o. female who presents for evaluation and treatment of obesity and related comorbidities. Current BMI is Body mass index is 38.45 kg/m. Christina Brennan has been struggling with her weight for many years and has been unsuccessful in either losing weight, maintaining weight loss, or reaching her healthy weight goal.  Christina Brennan is currently in the action stage of change and ready to dedicate time achieving and maintaining a healthier weight. Christina Brennan is interested in becoming our patient and working on intensive lifestyle modifications including (but not limited to) diet and exercise for weight loss.  Referred by [facility]. 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 Her goal is to go skydiving. Skipping breakfast M-F and lunch 1-2 times a week. Pt skips breakfast due to time and dislike of breakfast food. Coffee in morning with half and half; May pack leftovers if there is lunch like >4 oz stir fry with 1/2 cup rice (feel full); 6PM Supper- Burgers (5 oz) with tater tots with broccoli or asparagus with ketchup, mustard, mayo + 2-3 servings tater tots (feel full); After dinner- 1 cup ice cream.  Christina Brennan's habits were reviewed today and are as follows: Her family eats meals together, her desired weight loss is 89 lbs, she has been heavy most of her life, she started gaining weight in 2005, her heaviest weight ever was 230 pounds, she has significant food cravings issues, she snacks frequently in the evenings, she skips meals frequently, she is frequently drinking liquids with calories, she frequently makes poor food choices, she has problems with excessive hunger, she frequently eats larger portions than normal, she has binge eating behaviors, and she struggles with emotional eating.  Depression  Screen [name]'s Food and Mood (modified PHQ-9) score was 21.  Depression screen PHQ 2/9 08/07/2021  Decreased Interest 1  Down, Depressed, Hopeless 3  PHQ - 2 Score 4  Altered sleeping 3  Tired, decreased energy 3  Change in appetite 3  Feeling bad or failure about yourself  3  Trouble concentrating 3  Moving slowly or fidgety/restless 1  Suicidal thoughts 1  PHQ-9 Score 21  Difficult doing work/chores Somewhat difficult   Subjective:   1. Other fatigue [name] admits to daytime somnolence and admits to waking up still tired. Christina Brennan has a history of symptoms of morning fatigue. Christina Brennan generally gets  6-8  hours of sleep per night, and states that she has generally restful sleep. Snoring (NO? *sleep apnea) present. Apneic episodes (NO? *sleep apnea) present. Epworth Sleepiness Score is 3. EKG normal sinus rhythm at 66 bpm.  2. SOBOE (shortness of breath on exertion) Christina Brennan notes increasing shortness of breath with exercising and seems to be worsening over time with weight gain. She notes getting out of breath sooner with activity than she used to. This has gotten worse recently. Christina Brennan denies shortness of breath at rest or orthopnea.  3. Type 2 diabetes mellitus with hyperglycemia, without long-term current use of insulin (HCC) Pt's A1c was 6.6 in 2013 but improved to 6.1 recently. She is not on meds.  4. Hypothyroidism, unspecified type Pt takes levothyroxine daily. She denies cold or hot intolerances or palpitations.  5. Vitamin D deficiency Historical diagnosis. Pt is on OTC Vit D.  6. Anxiety and depression Pt is not on meds. Her  symptoms have improved recently. Pt reports some emotional eating.  Assessment/Plan:   1. Other fatigue Christina Brennan does feel that her weight is causing her energy to be lower than it should be. Fatigue may be related to obesity, depression or many other causes. Labs will be ordered, and in the meanwhile, Tamey will focus on self care including  making healthy food choices, increasing physical activity and focusing on stress reduction. Check labs today.  - EKG 12-Lead - Vitamin B12 - Folate  2. SOBOE (shortness of breath on exertion) Christina Brennan does feel that she gets out of breath more easily that she used to when she exercises. Christina Brennan's shortness of breath appears to be obesity related and exercise induced. She has agreed to work on weight loss and gradually increase exercise to treat her exercise induced shortness of breath. Will continue to monitor closely. Check labs today.  - CBC with Differential/Platelet  3. Type 2 diabetes mellitus with hyperglycemia, without long-term current use of insulin (HCC) Good blood sugar control is important to decrease the likelihood of diabetic complications such as nephropathy, neuropathy, limb loss, blindness, coronary artery disease, and death. Intensive lifestyle modification including diet, exercise and weight loss are the first line of treatment for diabetes. Check labs today.  - Comprehensive metabolic panel - Hemoglobin A1c - Insulin, random - Lipid Panel With LDL/HDL Ratio  4. Hypothyroidism, unspecified type Patient with long-standing hypothyroidism, on levothyroxine therapy. She appears euthyroid. Orders and follow up as documented in patient record.  Counseling Good thyroid control is important for overall health. Supratherapeutic thyroid levels are dangerous and will not improve weight loss results. The correct way to take levothyroxine is fasting, with water, separated by at least 30 minutes from breakfast, and separated by more than 4 hours from calcium, iron, multivitamins, acid reflux medications (PPIs).  Check labs today.  - T3 - T4, free - TSH  5. Vitamin D deficiency Low Vitamin D level contributes to fatigue and are associated with obesity, breast, and colon cancer. She agrees to continue to take OTC Vitamin D daily and will follow-up for routine testing of Vitamin D,  at least 2-3 times per year to avoid over-replacement. Check labs today.  - VITAMIN D 25 Hydroxy (Vit-D Deficiency, Fractures)  6. Anxiety and depression Behavior modification techniques were discussed today to help Maryam deal with her anxiety.  Orders and follow up as documented in patient record. Follow up on symptoms at next appt.  7. Obesity with current BMI of 38.5  Celia is currently in the action stage of change and her goal is to continue with weight loss efforts. I recommend Elener begin the structured treatment plan as follows:  She has agreed to the Category 3 Plan with 8 oz at night.  Exercise goals: No exercise has been prescribed at this time.   Behavioral modification strategies: increasing lean protein intake, meal planning and cooking strategies, keeping healthy foods in the home, and planning for success.  She was informed of the importance of frequent follow-up visits to maximize her success with intensive lifestyle modifications for her multiple health conditions. She was informed we would discuss her lab results at her next visit unless there is a critical issue that needs to be addressed sooner. Alegria agreed to keep her next visit at the agreed upon time to discuss these results.  Objective:   Blood pressure 115/73, pulse 69, temperature 97.9 F (36.6 C), height 5\' 4"  (1.626 m), weight 224 lb (101.6 kg), last menstrual period 07/30/2021,  SpO2 98 %. Body mass index is 38.45 kg/m.  EKG: Normal sinus rhythm, rate 66.  Indirect Calorimeter completed today shows a VO2 of 246 and a REE of 1699.  Her calculated basal metabolic rate is [id number] thus her basal metabolic rate is worse than expected.  General: Cooperative, alert, well developed, in no acute distress. HEENT: Conjunctivae and lids unremarkable. Cardiovascular: Regular rhythm.  Lungs: Normal work of breathing. Neurologic: No focal deficits.   Lab Results  Component Value Date   CREATININE 0.57  08/07/2021   BUN 12 08/07/2021   NA 139 08/07/2021   K 4.8 08/07/2021   CL 103 08/07/2021   CO2 24 08/07/2021   Lab Results  Component Value Date   ALT 25 08/07/2021   AST 16 08/07/2021   ALKPHOS 89 08/07/2021   BILITOT 0.3 08/07/2021   Lab Results  Component Value Date   HGBA1C 6.5 (H) 08/07/2021   Lab Results  Component Value Date   INSULIN 8.8 08/07/2021   Lab Results  Component Value Date   TSH 1.930 08/07/2021   Lab Results  Component Value Date   CHOL 224 (H) 08/07/2021   HDL 73 08/07/2021   LDLCALC 134 (H) 08/07/2021   TRIG 99 08/07/2021   Lab Results  Component Value Date   WBC 5.8 08/07/2021   HGB 13.1 08/07/2021   HCT 41.3 08/07/2021   MCV 84 08/07/2021   PLT 296 08/07/2021    Attestation Statements:   Reviewed by clinician on day of visit: allergies, medications, problem list, medical history, surgical history, family history, social history, and previous encounter notes.  Edmund HildaI, Tamesha Frazier, CMA, am acting as transcriptionist for Reuben LikesAlexandria Jalilah Wiltsie, MD.  This is the patient's first visit at [facility]. The patient's NEW PATIENT PACKET was reviewed at length. Included in the packet: current and past health history, medications, allergies, ROS, gynecologic history (women only), surgical history, family history, social history, weight history, weight loss surgery history (for those that have had weight loss surgery), nutritional evaluation, mood and food questionnaire, PHQ9, Epworth questionnaire, sleep habits questionnaire, patient life and health improvement goals questionnaire. These will all be scanned into the patient's chart under media.   During the visit, I independently reviewed the patient's EKG, bioimpedance scale results, and indirect calorimeter results. I used this information to tailor a meal plan for the patient that will help her to lose weight and will improve her obesity-related conditions going forward. I performed a  medically necessary appropriate examination and/or evaluation. I discussed the assessment and treatment plan with the patient. The patient was provided an opportunity to ask questions and all were answered. The patient agreed with the plan and demonstrated an understanding of the instructions. Labs were ordered at this visit and will be reviewed at the next visit unless more critical results need to be addressed immediately. Clinical information was updated and documented in the EMR.   Time spent on visit including pre-visit chart review and post-visit care was 45 minutes.   A separate 15 minutes was spent on risk counseling (see above).    I have reviewed the above documentation for accuracy and completeness, and I agree with the above. - Reuben LikesAlexandria Jarae Nemmers, MD

## 2021-08-21 ENCOUNTER — Other Ambulatory Visit: Payer: Self-pay

## 2021-08-21 ENCOUNTER — Ambulatory Visit (INDEPENDENT_AMBULATORY_CARE_PROVIDER_SITE_OTHER): Payer: Managed Care, Other (non HMO) | Admitting: Family Medicine

## 2021-08-21 ENCOUNTER — Encounter (INDEPENDENT_AMBULATORY_CARE_PROVIDER_SITE_OTHER): Payer: Self-pay | Admitting: Family Medicine

## 2021-08-21 VITALS — BP 111/73 | HR 76 | Temp 98.3°F | Ht 64.0 in | Wt 217.0 lb

## 2021-08-21 DIAGNOSIS — E785 Hyperlipidemia, unspecified: Secondary | ICD-10-CM

## 2021-08-21 DIAGNOSIS — E1169 Type 2 diabetes mellitus with other specified complication: Secondary | ICD-10-CM | POA: Diagnosis not present

## 2021-08-21 DIAGNOSIS — E1165 Type 2 diabetes mellitus with hyperglycemia: Secondary | ICD-10-CM | POA: Diagnosis not present

## 2021-08-21 DIAGNOSIS — E559 Vitamin D deficiency, unspecified: Secondary | ICD-10-CM | POA: Diagnosis not present

## 2021-08-21 DIAGNOSIS — E669 Obesity, unspecified: Secondary | ICD-10-CM

## 2021-08-21 DIAGNOSIS — Z6837 Body mass index (BMI) 37.0-37.9, adult: Secondary | ICD-10-CM

## 2021-08-21 DIAGNOSIS — E038 Other specified hypothyroidism: Secondary | ICD-10-CM

## 2021-08-21 DIAGNOSIS — Z6838 Body mass index (BMI) 38.0-38.9, adult: Secondary | ICD-10-CM

## 2021-08-21 MED ORDER — VITAMIN D3 125 MCG (5000 UT) PO CAPS: 5000.0000 [IU] | ORAL_CAPSULE | Freq: Every day | ORAL | 0 refills | Status: AC

## 2021-08-21 NOTE — Progress Notes (Signed)
Chief Complaint:   OBESITY Christina Brennan is here to discuss her progress with her obesity treatment plan along with follow-up of her obesity related diagnoses. Christina Brennan is on the Category 3 Plan and states she is following her eating plan approximately 75% of the time. Christina Brennan states she is doing 10K steps at work 4 times per week.  Today's visit was #: 2 Starting weight: 224 lbs Starting date: 08/07/2021 Today's weight: 217 lbs Today's date: 08/21/2021 Total lbs lost to date: 7 Total lbs lost since last in-office visit: 7  Interim History: Pt hasn't been hungry- has been realistic with following plan and not trying to be perfect. She has been bringing breakfast and lunch and prepping the night before. Dinner has been mostly vegetables and meat. She is occasionally eating a few bites of a dessert. Pt is feeling better overall. She is not always getting in 300 calories for snack.  Subjective:   1. Type 2 diabetes mellitus with hyperglycemia, without long-term current use of insulin (HCC) Pt's last A1c was 6.5 with an insulin level of 8.8. She is not on meds.  2. Hyperlipidemia associated with type 2 diabetes mellitus (HCC) Pt has an LDL of 134, HDL 73, and triglycerides 99. She is not on meds. 10 year ASCVD 2.2% (optimal 1.2%)- moderate intensity statin indicated.  3. Vitamin D deficiency Pt's Vit D level is 40.1 and she denies nausea, vomiting, and muscle weakness. She is on OTC Vit D 2K IU daily.  4. Other specified hypothyroidism TSH within normal limits. Pt is on Synthroid 75 mcg daily.  Assessment/Plan:   1. Type 2 diabetes mellitus with hyperglycemia, without long-term current use of insulin (HCC) Discussed at length pathophysiology of diabetes mellitus. Also discussed the initiation of Metformin. Pt would ike to wait.  2. Hyperlipidemia associated with type 2 diabetes mellitus (HCC) Discussed the risks and benefits of statin therapy. Pt is to think about medication.  3.  Vitamin D deficiency Low Vitamin D level contributes to fatigue and are associated with obesity, breast, and colon cancer. She agrees to change OTC Vitamin D 5,000 IU daily and will follow-up for routine testing of Vitamin D, at least 2-3 times per year to avoid over-replacement.  Start- Cholecalciferol (VITAMIN D3) 125 MCG (5000 UT) CAPS; Take 1 capsule (5,000 Units total) by mouth daily.  Dispense: 30 capsule; Refill: 0  4. Other specified hypothyroidism Patient with long-standing hypothyroidism, on levothyroxine therapy. She appears euthyroid. Orders and follow up as documented in patient record. Continue Synthroid and repeat labs in 3 months.  Counseling Good thyroid control is important for overall health. Supratherapeutic thyroid levels are dangerous and will not improve weight loss results. The correct way to take levothyroxine is fasting, with water, separated by at least 30 minutes from breakfast, and separated by more than 4 hours from calcium, iron, multivitamins, acid reflux medications (PPIs).   5. Obesity with current BMI of 37.4 Christina Brennan is currently in the action stage of change. As such, her goal is to continue with weight loss efforts. She has agreed to the Category 3 Plan.   Exercise goals:  As is  Behavioral modification strategies: increasing lean protein intake, meal planning and cooking strategies, and keeping healthy foods in the home.  Christina Brennan has agreed to follow-up with our clinic in 2 weeks. She was informed of the importance of frequent follow-up visits to maximize her success with intensive lifestyle modifications for her multiple health conditions.   Objective:   Blood pressure  111/73, pulse 76, temperature 98.3 F (36.8 C), height 5\' 4"  (1.626 m), weight 217 lb (98.4 kg), last menstrual period 07/30/2021, SpO2 97 %. Body mass index is 37.25 kg/m.  General: Cooperative, alert, well developed, in no acute distress. HEENT: Conjunctivae and lids  unremarkable. Cardiovascular: Regular rhythm.  Lungs: Normal work of breathing. Neurologic: No focal deficits.   Lab Results  Component Value Date   CREATININE 0.57 08/07/2021   BUN 12 08/07/2021   NA 139 08/07/2021   K 4.8 08/07/2021   CL 103 08/07/2021   CO2 24 08/07/2021   Lab Results  Component Value Date   ALT 25 08/07/2021   AST 16 08/07/2021   ALKPHOS 89 08/07/2021   BILITOT 0.3 08/07/2021   Lab Results  Component Value Date   HGBA1C 6.5 (H) 08/07/2021   Lab Results  Component Value Date   INSULIN 8.8 08/07/2021   Lab Results  Component Value Date   TSH 1.930 08/07/2021   Lab Results  Component Value Date   CHOL 224 (H) 08/07/2021   HDL 73 08/07/2021   LDLCALC 134 (H) 08/07/2021   TRIG 99 08/07/2021   Lab Results  Component Value Date   VD25OH 40.1 08/07/2021   Lab Results  Component Value Date   WBC 5.8 08/07/2021   HGB 13.1 08/07/2021   HCT 41.3 08/07/2021   MCV 84 08/07/2021   PLT 296 08/07/2021    Attestation Statements:   Reviewed by clinician on day of visit: allergies, medications, problem list, medical history, surgical history, family history, social history, and previous encounter notes.  10/05/2021, CMA, am acting as transcriptionist for Edmund Hilda, MD.  I have reviewed the above documentation for accuracy and completeness, and I agree with the above. - Reuben Likes, MD

## 2021-09-11 ENCOUNTER — Encounter (INDEPENDENT_AMBULATORY_CARE_PROVIDER_SITE_OTHER): Payer: Self-pay | Admitting: Family Medicine

## 2021-09-11 ENCOUNTER — Other Ambulatory Visit: Payer: Self-pay

## 2021-09-11 ENCOUNTER — Ambulatory Visit (INDEPENDENT_AMBULATORY_CARE_PROVIDER_SITE_OTHER): Payer: Managed Care, Other (non HMO) | Admitting: Family Medicine

## 2021-09-11 VITALS — BP 114/73 | HR 74 | Temp 98.2°F | Ht 64.0 in | Wt 211.0 lb

## 2021-09-11 DIAGNOSIS — Z6836 Body mass index (BMI) 36.0-36.9, adult: Secondary | ICD-10-CM

## 2021-09-11 DIAGNOSIS — E785 Hyperlipidemia, unspecified: Secondary | ICD-10-CM

## 2021-09-11 DIAGNOSIS — Z6838 Body mass index (BMI) 38.0-38.9, adult: Secondary | ICD-10-CM

## 2021-09-11 DIAGNOSIS — E669 Obesity, unspecified: Secondary | ICD-10-CM

## 2021-09-11 DIAGNOSIS — E1165 Type 2 diabetes mellitus with hyperglycemia: Secondary | ICD-10-CM | POA: Diagnosis not present

## 2021-09-11 DIAGNOSIS — E1169 Type 2 diabetes mellitus with other specified complication: Secondary | ICD-10-CM | POA: Diagnosis not present

## 2021-09-11 NOTE — Progress Notes (Signed)
Chief Complaint:   OBESITY Christina Brennan is here to discuss her progress with her obesity treatment plan along with follow-up of her obesity related diagnoses. Christina Brennan is on the Category 3 Plan and states she is following her eating plan approximately 50% of the time. Christina Brennan states she is doing yard work 60 minutes 1 times per week.  Today's visit was #: 3 Starting weight: 224 lbs Starting date: 08/07/2021 Today's weight: 211 lbs Today's date: 09/11/2021 Total lbs lost to date: 13 Total lbs lost since last in-office visit: 6  Interim History: Pt has been very busy over the last few weeks. The first 2 weeks after her last appt, were particularly difficult. She has been being more mindful over the last week, especially. She did start meal prepping last week and realized it was significantly easier to stay on plan. Pt has 2 birthdays and a trip to [place] at the end of March.  Subjective:   1. Type 2 diabetes mellitus with hyperglycemia, without long-term current use of insulin (HCC) Pt's last A1c was 6.5 and she is not on meds.  2. Hyperlipidemia associated with type 2 diabetes mellitus (HCC) Christina Brennan has an LDL of 134, HDL 73, and triglycerides 99. She is not on statin therapy.  Assessment/Plan:   1. Type 2 diabetes mellitus with hyperglycemia, without long-term current use of insulin (HCC) Good blood sugar control is important to decrease the likelihood of diabetic complications such as nephropathy, neuropathy, limb loss, blindness, coronary artery disease, and death. Intensive lifestyle modification including diet, exercise and weight loss are the first line of treatment for diabetes. Category 3 with journaling for supper. Repeat labs in June 2023.   2. Hyperlipidemia associated with type 2 diabetes mellitus (HCC) Cardiovascular risk and specific lipid/LDL goals reviewed.  We discussed several lifestyle modifications today and Christina Brennan will continue to work on diet, exercise and weight  loss efforts. Orders and follow up as documented in patient record. Not at goal. Repeat labs in June.  Counseling Intensive lifestyle modifications are the first line treatment for this issue. Dietary changes: Increase soluble fiber. Decrease simple carbohydrates. Exercise changes: Moderate to vigorous-intensity aerobic activity 150 minutes per week if tolerated. Lipid-lowering medications: see documented in medical record.  3. Obesity with current BMI of 36.2 Christina Brennan is currently in the action stage of change. As such, her goal is to continue with weight loss efforts. She has agreed to the Category 3 Plan and keeping a food journal and adhering to recommended goals of 450-600 calories and 40+ grams protein with supper.   Exercise goals: All adults should avoid inactivity. Some physical activity is better than none, and adults who participate in any amount of physical activity gain some health benefits.  Behavioral modification strategies: increasing lean protein intake, meal planning and cooking strategies, keeping healthy foods in the home, and planning for success.  Christina Brennan has agreed to follow-up with our clinic in 2-3 weeks. She was informed of the importance of frequent follow-up visits to maximize her success with intensive lifestyle modifications for her multiple health conditions.   Objective:   Blood pressure 114/73, pulse 74, temperature 98.2 F (36.8 C), height 5\' 4"  (1.626 m), weight 211 lb (95.7 kg), SpO2 98 %. Body mass index is 36.22 kg/m.  General: Cooperative, alert, well developed, in no acute distress. HEENT: Conjunctivae and lids unremarkable. Cardiovascular: Regular rhythm.  Lungs: Normal work of breathing. Neurologic: No focal deficits.   Lab Results  Component Value Date   CREATININE  0.57 08/07/2021   BUN 12 08/07/2021   NA 139 08/07/2021   K 4.8 08/07/2021   CL 103 08/07/2021   CO2 24 08/07/2021   Lab Results  Component Value Date   ALT 25 08/07/2021    AST 16 08/07/2021   ALKPHOS 89 08/07/2021   BILITOT 0.3 08/07/2021   Lab Results  Component Value Date   HGBA1C 6.5 (H) 08/07/2021   Lab Results  Component Value Date   INSULIN 8.8 08/07/2021   Lab Results  Component Value Date   TSH 1.930 08/07/2021   Lab Results  Component Value Date   CHOL 224 (H) 08/07/2021   HDL 73 08/07/2021   LDLCALC 134 (H) 08/07/2021   TRIG 99 08/07/2021   Lab Results  Component Value Date   VD25OH 40.1 08/07/2021   Lab Results  Component Value Date   WBC 5.8 08/07/2021   HGB 13.1 08/07/2021   HCT 41.3 08/07/2021   MCV 84 08/07/2021   PLT 296 08/07/2021    Attestation Statements:   Reviewed by clinician on day of visit: allergies, medications, problem list, medical history, surgical history, family history, social history, and previous encounter notes.  Edmund Hilda, CMA, am acting as transcriptionist for Reuben Likes, MD.   I have reviewed the above documentation for accuracy and completeness, and I agree with the above. - Reuben Likes, MD

## 2021-09-25 ENCOUNTER — Encounter (INDEPENDENT_AMBULATORY_CARE_PROVIDER_SITE_OTHER): Payer: Self-pay | Admitting: Adult Health

## 2021-09-25 ENCOUNTER — Ambulatory Visit (INDEPENDENT_AMBULATORY_CARE_PROVIDER_SITE_OTHER): Payer: Managed Care, Other (non HMO) | Admitting: Adult Health

## 2021-09-25 ENCOUNTER — Other Ambulatory Visit: Payer: Self-pay

## 2021-09-25 VITALS — BP 116/73 | HR 74 | Temp 97.9°F | Ht 64.0 in | Wt 210.0 lb

## 2021-09-25 DIAGNOSIS — E785 Hyperlipidemia, unspecified: Secondary | ICD-10-CM | POA: Diagnosis not present

## 2021-09-25 DIAGNOSIS — E669 Obesity, unspecified: Secondary | ICD-10-CM | POA: Diagnosis not present

## 2021-09-25 DIAGNOSIS — Z6838 Body mass index (BMI) 38.0-38.9, adult: Secondary | ICD-10-CM

## 2021-09-25 DIAGNOSIS — E1169 Type 2 diabetes mellitus with other specified complication: Secondary | ICD-10-CM | POA: Diagnosis not present

## 2021-09-25 DIAGNOSIS — E1165 Type 2 diabetes mellitus with hyperglycemia: Secondary | ICD-10-CM

## 2021-09-25 DIAGNOSIS — Z6836 Body mass index (BMI) 36.0-36.9, adult: Secondary | ICD-10-CM

## 2021-09-25 NOTE — Progress Notes (Signed)
? ? ? ?Chief Complaint:  ? ?OBESITY ?Christina Brennan is here to discuss her progress with her obesity treatment plan along with follow-up of her obesity related diagnoses. Christina Brennan is on the Category 3 Plan and keeping a food journal and adhering to recommended goals of 450-600 calories and 40+ grams of protein at supper and states she is following her eating plan approximately 50% of the time. Christina Brennan states she is walking for 15 minutes 3 times per week. ? ?Today's visit was #: 4 ?Starting weight: 224 lbs ?Starting date: 08/07/2021 ?Today's weight: 210 lbs ?Today's date: 09/25/2021 ?Total lbs lost to date: 14 lbs ?Total lbs lost since last in-office visit: 1 lb ? ?Interim History:  ?Christina Brennan has been experiencing late afternoon polyphagia. ?Recently at work she has started a walking routine- up and down the stairs for 15 minutes 3 times per week. 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 ? ?Of Note- pt is new to me. ? ?Subjective:  ? ?1. Type 2 diabetes mellitus with hyperglycemia, without long-term current use of insulin (HCC) ?On 08/07/2021, A1c 6.5 - NEW [place]. ?Family history - paternal grandmother - diabetic. ?Her father did not have T2D.   ?She declines metformin therapy. ? ?2. Hyperlipidemia associated with type 2 diabetes mellitus (HCC) ?She denies known family history of hyperlipidemia. ?She denies tobacco/vape use. ?She estimates to consume red meat 3-4 times per week. ? ?Assessment/Plan:  ? ?1. Type 2 diabetes mellitus with hyperglycemia, without long-term current use of insulin (HCC) ?Continue Category 3 meal plan and regular exercise. ? ?2. Hyperlipidemia associated with type 2 diabetes mellitus (HCC) ?Decrease red meat to a maximum of 2 times per week. ?Continue regular walking. ? ?3. Obesity with current BMI of 36.1 ? ?Christina Brennan is currently in the action stage of change. As such, her goal is to continue with weight loss efforts. She has agreed to the Category 3 Plan and keeping a food journal  and adhering to recommended goals of 450-600 calories and 40+ grams of protein at supper.  ? ?Exercise goals:  As is. ? ?Behavioral modification strategies: increasing lean protein intake, decreasing simple carbohydrates, meal planning and cooking strategies, keeping healthy foods in the home, planning for success, and keeping a strict food journal. ? ?Christina Brennan has agreed to follow-up with our clinic in 2-3 weeks. She was informed of the importance of frequent follow-up visits to maximize her success with intensive lifestyle modifications for her multiple health conditions.  ? ?Objective:  ? ?Blood pressure 116/73, pulse 74, temperature 97.9 ?F (36.6 ?C), height 5\' 4"  (1.626 m), weight 210 lb (95.3 kg), SpO2 97 %. ?Body mass index is 36.05 kg/m?. ? ?General: Cooperative, alert, well developed, in no acute distress. ?HEENT: Conjunctivae and lids unremarkable. ?Cardiovascular: Regular rhythm.  ?Lungs: Normal work of breathing. ?Neurologic: No focal deficits.  ? ?Lab Results  ?Component Value Date  ? CREATININE 0.57 08/07/2021  ? BUN 12 08/07/2021  ? NA 139 08/07/2021  ? K 4.8 08/07/2021  ? CL 103 08/07/2021  ? CO2 24 08/07/2021  ? ?Lab Results  ?Component Value Date  ? ALT 25 08/07/2021  ? AST 16 08/07/2021  ? ALKPHOS 89 08/07/2021  ? BILITOT 0.3 08/07/2021  ? ?Lab Results  ?Component Value Date  ? HGBA1C 6.5 (H) 08/07/2021  ? ?Lab Results  ?Component Value Date  ? INSULIN 8.8 08/07/2021  ? ?Lab Results  ?Component Value Date  ? TSH 1.930 08/07/2021  ? ?Lab Results  ?Component Value Date  ?  CHOL 224 (H) 08/07/2021  ? HDL 73 08/07/2021  ? LDLCALC 134 (H) 08/07/2021  ? TRIG 99 08/07/2021  ? ?Lab Results  ?Component Value Date  ? VD25OH 40.1 08/07/2021  ? ?Lab Results  ?Component Value Date  ? WBC 5.8 08/07/2021  ? HGB 13.1 08/07/2021  ? HCT 41.3 08/07/2021  ? MCV 84 08/07/2021  ? PLT 296 08/07/2021  ? ?Attestation Statements:  ? ?Reviewed by clinician on day of visit: allergies, medications, problem list, medical  history, surgical history, family history, social history, and previous encounter notes. ? ?Time spent on visit including pre-visit chart review and post-visit care and charting was 28 minutes.  ? ?I, Insurance claims handler, CMA, am acting as Energy manager for William Hamburger, NP. ? ?I have reviewed the above documentation for accuracy and completeness, and I agree with the above. - Shawntee Mainwaring d. Gumecindo Hopkin, NP-C ?

## 2021-10-09 ENCOUNTER — Other Ambulatory Visit: Payer: Self-pay

## 2021-10-09 ENCOUNTER — Ambulatory Visit (INDEPENDENT_AMBULATORY_CARE_PROVIDER_SITE_OTHER): Payer: Managed Care, Other (non HMO) | Admitting: Family Medicine

## 2021-10-09 ENCOUNTER — Encounter (INDEPENDENT_AMBULATORY_CARE_PROVIDER_SITE_OTHER): Payer: Self-pay | Admitting: Family Medicine

## 2021-10-09 VITALS — BP 110/70 | HR 63 | Temp 98.0°F | Ht 64.0 in

## 2021-10-09 DIAGNOSIS — E038 Other specified hypothyroidism: Secondary | ICD-10-CM | POA: Diagnosis not present

## 2021-10-09 DIAGNOSIS — E669 Obesity, unspecified: Secondary | ICD-10-CM

## 2021-10-09 DIAGNOSIS — Z6835 Body mass index (BMI) 35.0-35.9, adult: Secondary | ICD-10-CM | POA: Diagnosis not present

## 2021-10-09 DIAGNOSIS — E559 Vitamin D deficiency, unspecified: Secondary | ICD-10-CM | POA: Diagnosis not present

## 2021-10-11 IMAGING — MG MM DIGITAL SCREENING BILAT W/ TOMO AND CAD
8 series · 8 of 24 positions shown · non-contrast
Comparison: Previous exam(s).

CLINICAL DATA: Screening.

EXAM:
DIGITAL SCREENING BILATERAL MAMMOGRAM WITH TOMO AND CAD

[L MLO synth-2D]
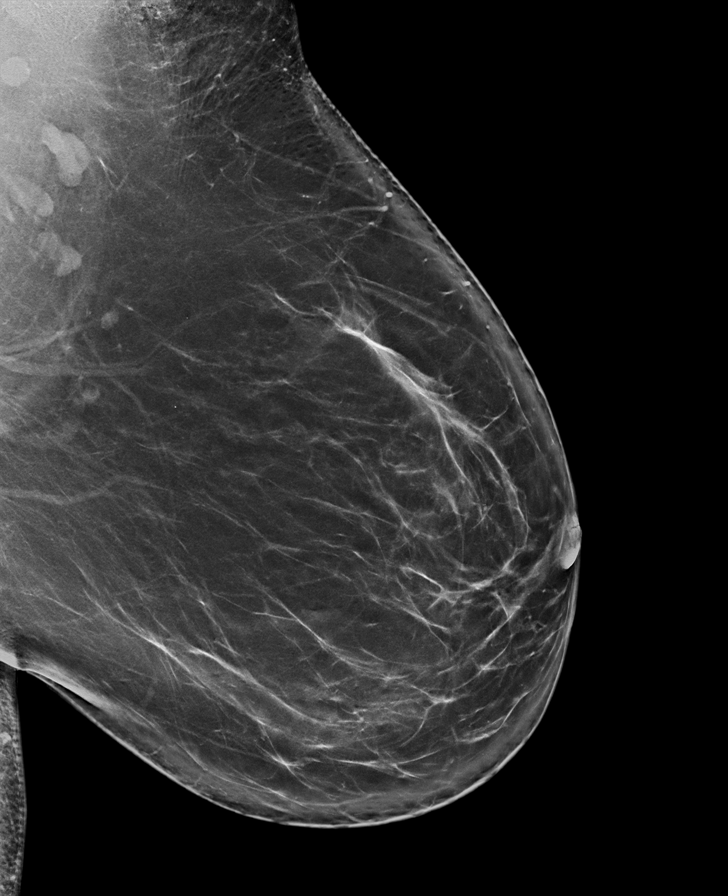

[L CC synth-2D]
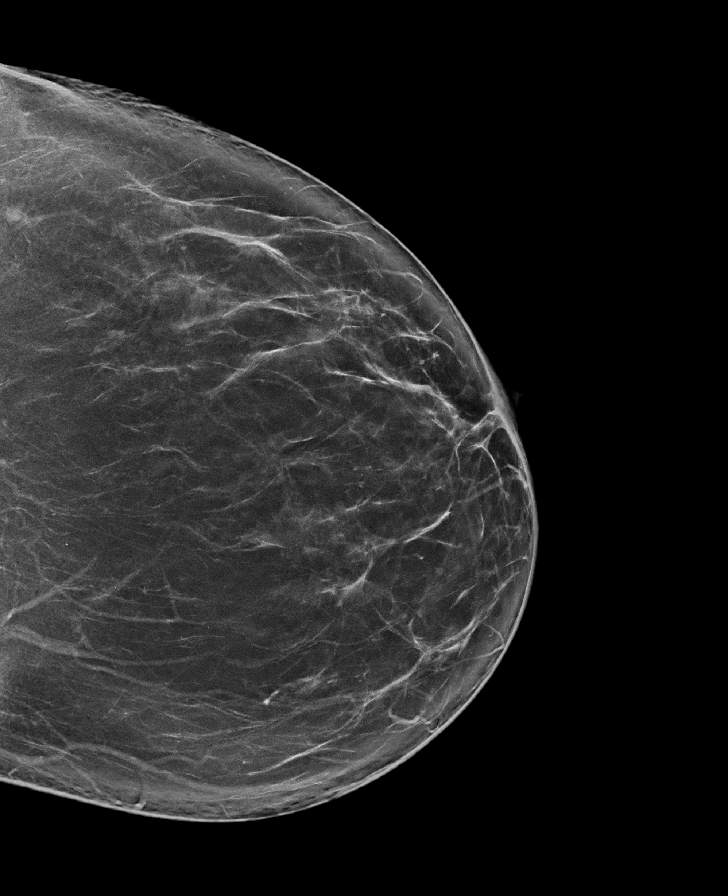

[R MLO synth-2D]
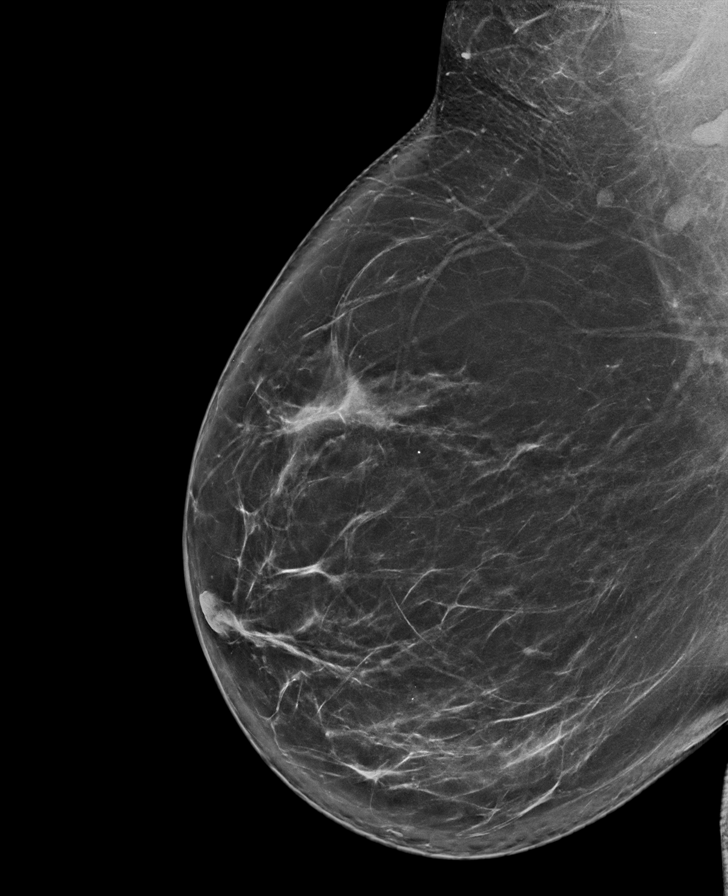

[R CC synth-2D]
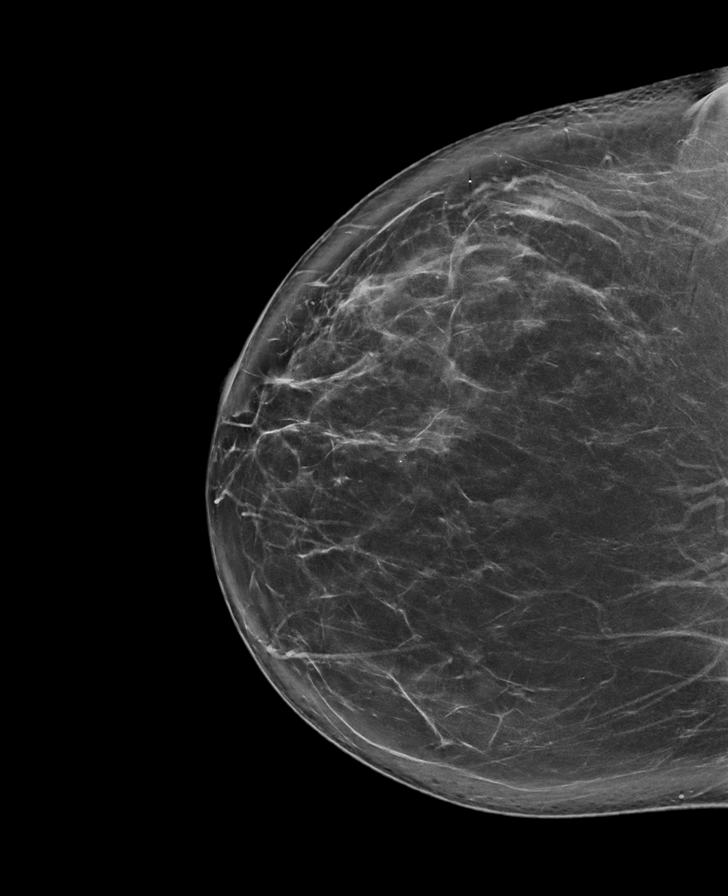

[R MLO tomo · tomo slice 49/96.0]
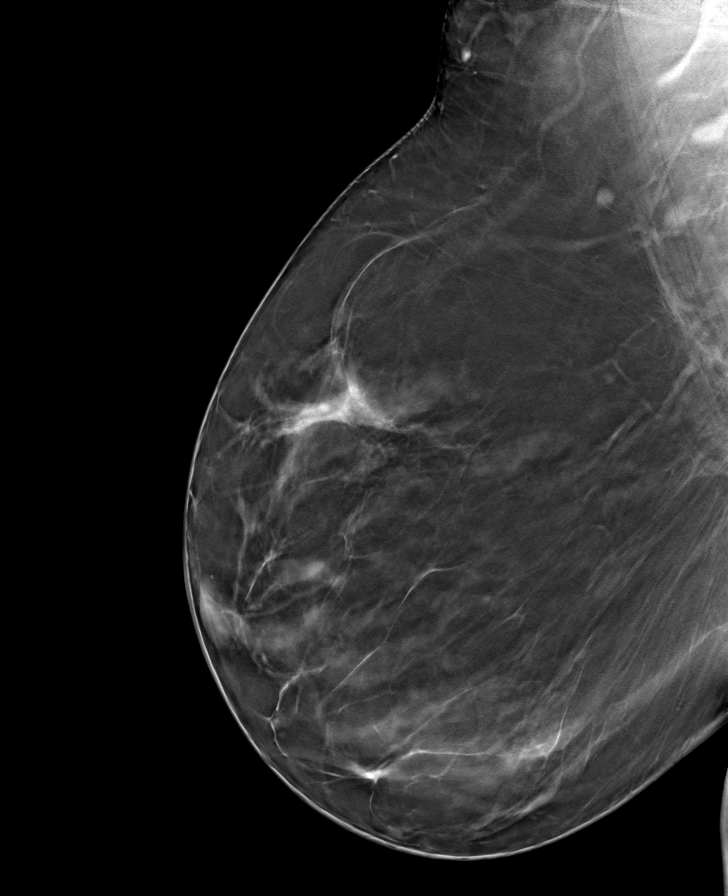

[R CC tomo · tomo slice 49/96.0]
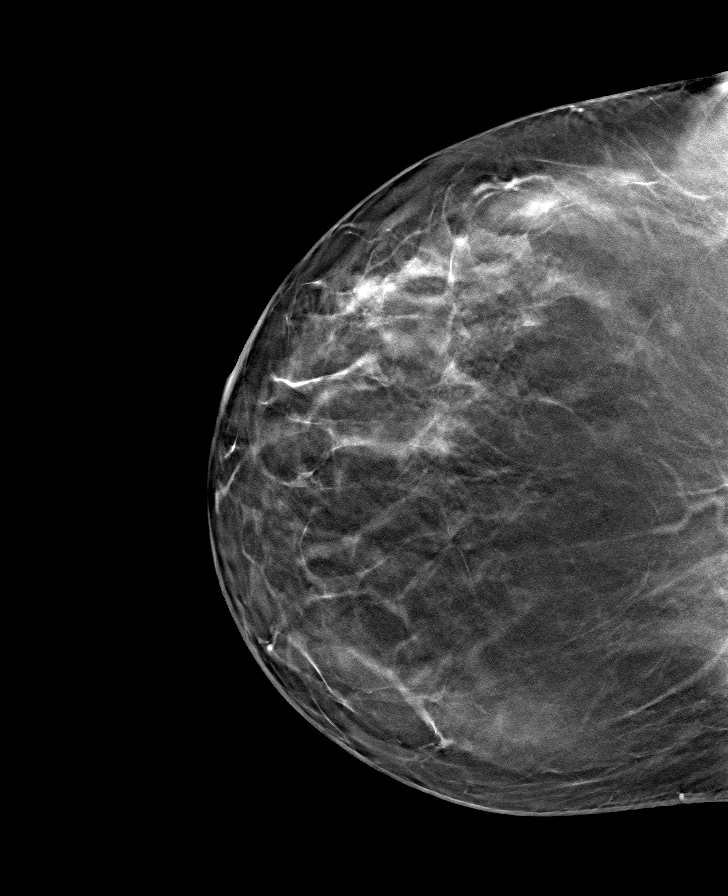

[L CC tomo · tomo slice 47/92.0]
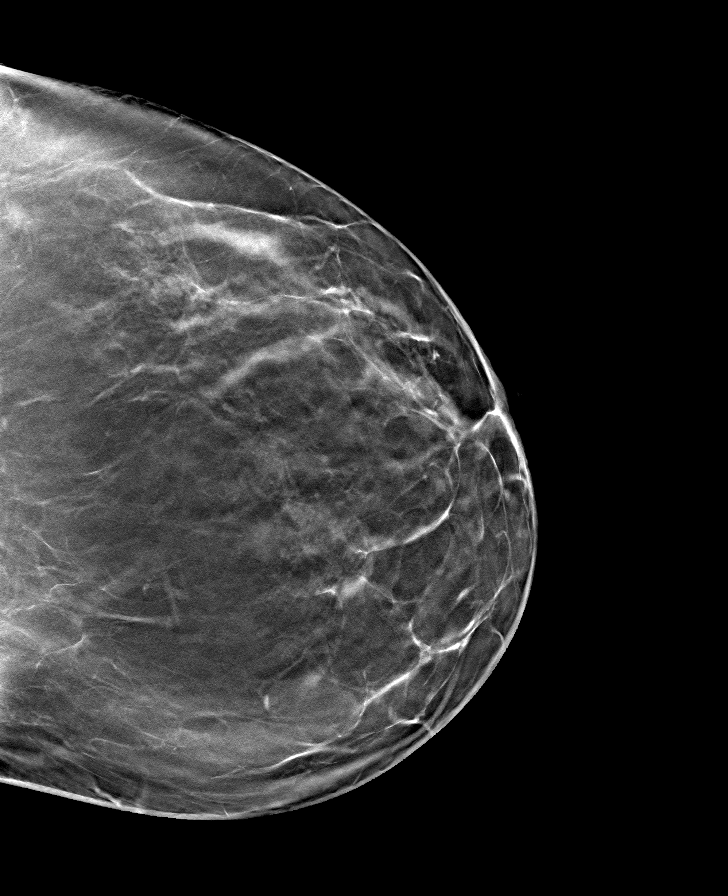

[L MLO tomo · tomo slice 52/103.0]
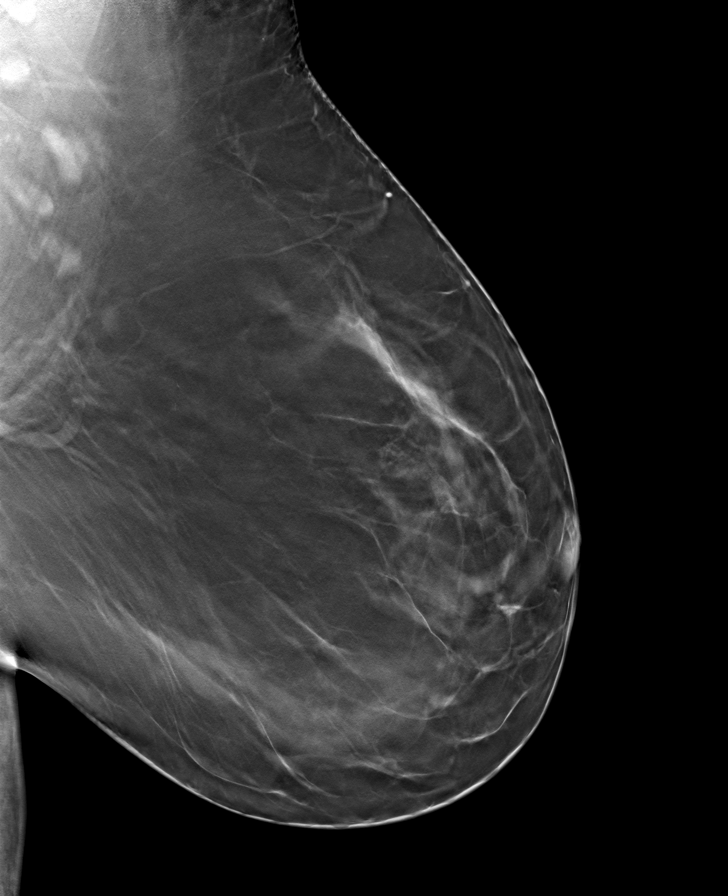

[8 of 24 positions shown; findings below may reference images not displayed]

ACR Breast Density Category b: There are scattered areas of
fibroglandular density.
FINDINGS: There are no findings suspicious for malignancy. The images were
evaluated with computer-aided detection.
IMPRESSION: No mammographic evidence of malignancy. A result letter of this
screening mammogram will be mailed directly to the patient.

RECOMMENDATION:
Screening mammogram in one year. (Code:ZP-7-VX7)

BI-RADS CATEGORY  1: Negative.

## 2021-10-13 ENCOUNTER — Other Ambulatory Visit: Payer: Self-pay | Admitting: Student

## 2021-10-13 ENCOUNTER — Other Ambulatory Visit: Payer: Self-pay | Admitting: Obstetrics and Gynecology

## 2021-10-13 DIAGNOSIS — Z1231 Encounter for screening mammogram for malignant neoplasm of breast: Secondary | ICD-10-CM

## 2021-10-14 NOTE — Progress Notes (Signed)
? ? ? ?Chief Complaint:  ? ?OBESITY ?Christina Brennan is here to discuss her progress with her obesity treatment plan along with follow-up of her obesity related diagnoses. Christina Brennan is on the Category 3 Plan and keeping a food journal and adhering to recommended goals of 450-600 calories and 40+ grams of protein at supper daily and states she is following her eating plan approximately 75-80% of the time. Christina Brennan states she is walking the stairs for 15 minutes 4 times per week. ? ?Today's visit was #: 5 ?Starting weight: 224 lbs ?Starting date: 08/07/2021 ?Today's weight: 207 lbs ?Today's date: 10/09/2021 ?Total lbs lost to date: 17 ?Total lbs lost since last in-office visit: 3 ? ?Interim History: Christina Brennan's last 2 weeks was particularly challenging. Two birthday celebrations of her BF and mom. She mad a cake and did eat out ([employer] and [employer]). She is being mindful of her choices and quantity of her indulgences. She has been learning to cook fish. Focusing on incorporating activity throughout the day. ? ?Subjective:  ? ?1. Vitamin D deficiency ?Christina Brennan is on OTC Vit D, and she notes fatigue. ? ?2. Other specified hypothyroidism ?Christina Brennan is on levothyroxine 75 mcg. She denies cold/heat intolerance, or palpitations. ? ?Assessment/Plan:  ? ?1. Vitamin D deficiency ?Electra will continue Vitamin D OTC, with no change in dose. ? ?2. Other specified hypothyroidism ?Christina Brennan will continue levothyroxine at her current dose. ? ?3. Obesity with current BMI of 35.6 ?Christina Brennan is currently in the action stage of change. As such, her goal is to continue with weight loss efforts. She has agreed to the Category 2 Plan for breakfast and lunch, and the Category 3 Plan for dinner.  ? ?Exercise goals: All adults should avoid inactivity. Some physical activity is better than none, and adults who participate in any amount of physical activity gain some health benefits. ? ?Behavioral modification strategies: increasing lean protein  intake, meal planning and cooking strategies, keeping healthy foods in the home, and planning for success. ? ?Christina Brennan has agreed to follow-up with our clinic in 2 weeks. She was informed of the importance of frequent follow-up visits to maximize her success with intensive lifestyle modifications for her multiple health conditions.  ? ?Objective:  ? ?Blood pressure 110/70, pulse 63, temperature 98 ?F (36.7 ?C), height 5\' 4"  (1.626 m), SpO2 96 %. ?Body mass index is 36.05 kg/m?. ? ?General: Cooperative, alert, well developed, in no acute distress. ?HEENT: Conjunctivae and lids unremarkable. ?Cardiovascular: Regular rhythm.  ?Lungs: Normal work of breathing. ?Neurologic: No focal deficits.  ? ?Lab Results  ?Component Value Date  ? CREATININE 0.57 08/07/2021  ? BUN 12 08/07/2021  ? NA 139 08/07/2021  ? K 4.8 08/07/2021  ? CL 103 08/07/2021  ? CO2 24 08/07/2021  ? ?Lab Results  ?Component Value Date  ? ALT 25 08/07/2021  ? AST 16 08/07/2021  ? ALKPHOS 89 08/07/2021  ? BILITOT 0.3 08/07/2021  ? ?Lab Results  ?Component Value Date  ? HGBA1C 6.5 (H) 08/07/2021  ? ?Lab Results  ?Component Value Date  ? INSULIN 8.8 08/07/2021  ? ?Lab Results  ?Component Value Date  ? TSH 1.930 08/07/2021  ? ?Lab Results  ?Component Value Date  ? CHOL 224 (H) 08/07/2021  ? HDL 73 08/07/2021  ? LDLCALC 134 (H) 08/07/2021  ? TRIG 99 08/07/2021  ? ?Lab Results  ?Component Value Date  ? VD25OH 40.1 08/07/2021  ? ?Lab Results  ?Component Value Date  ? WBC 5.8 08/07/2021  ? HGB 13.1  08/07/2021  ? HCT 41.3 08/07/2021  ? MCV 84 08/07/2021  ? PLT 296 08/07/2021  ? ?No results found for: IRON, TIBC, FERRITIN ? ?Attestation Statements:  ? ?Reviewed by clinician on day of visit: allergies, medications, problem list, medical history, surgical history, family history, social history, and previous encounter notes. ? ? ?I, Burt Knack, am acting as transcriptionist for Reuben Likes, MD. ?I have reviewed the above documentation for accuracy and  completeness, and I agree with the above. Reuben Likes, MD ? ? ?

## 2021-10-23 ENCOUNTER — Ambulatory Visit (INDEPENDENT_AMBULATORY_CARE_PROVIDER_SITE_OTHER): Payer: Managed Care, Other (non HMO) | Admitting: Adult Health

## 2021-10-30 ENCOUNTER — Encounter (INDEPENDENT_AMBULATORY_CARE_PROVIDER_SITE_OTHER): Payer: Self-pay | Admitting: Nurse Practitioner

## 2021-10-30 ENCOUNTER — Ambulatory Visit (INDEPENDENT_AMBULATORY_CARE_PROVIDER_SITE_OTHER): Payer: Managed Care, Other (non HMO) | Admitting: Nurse Practitioner

## 2021-10-30 VITALS — BP 116/72 | HR 80 | Temp 97.7°F | Ht 64.0 in | Wt 208.0 lb

## 2021-10-30 DIAGNOSIS — Z6835 Body mass index (BMI) 35.0-35.9, adult: Secondary | ICD-10-CM | POA: Diagnosis not present

## 2021-10-30 DIAGNOSIS — E559 Vitamin D deficiency, unspecified: Secondary | ICD-10-CM

## 2021-10-30 DIAGNOSIS — E1165 Type 2 diabetes mellitus with hyperglycemia: Secondary | ICD-10-CM | POA: Diagnosis not present

## 2021-10-30 DIAGNOSIS — E669 Obesity, unspecified: Secondary | ICD-10-CM | POA: Diagnosis not present

## 2021-10-30 NOTE — Progress Notes (Signed)
? ? ? ?Chief Complaint:  ? ?OBESITY ?Christina Brennan is here to discuss her progress with her obesity treatment plan along with follow-up of her obesity related diagnoses. Christina Brennan is on the Category 2 Plan and the Category 3 Plan for breakfast & dinner and states she is following her eating plan approximately 15% of the time. Christina Brennan states she is exercising 0 minutes 0 times per week. ? ?Today's visit was #: 6 ?Starting weight: 224 lbs ?Starting date: 08/07/2021 ?Today's weight: 208 lbs ?Today's date: 10/30/2021 ?Total lbs lost to date: 16 ?Total lbs lost since last in-office visit: 0 ? ?Interim History: Christina Brennan has been on vacation since her last visit for 10 days in [place]. 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 Notes some family stressors. She's been trying to make good choices but has been stress eating. No vacations or traveling for the next couple of months. She drinks a lot of coffee with [employer]life creamer, diet sodas and limited water. ? ?Subjective:  ? ?1. Vitamin D deficiency ?Christina Brennan is currently taking Vit D, 50,00 IU weekly. Denies any nausea, vomiting or muscle weakness. ? ?2. Type 2 diabetes mellitus with hyperglycemia, without long-term current use of insulin (HCC) ?Christina Brennan's last A1c was 6.5. Denies hunger. Reports craving of sweets. ? ?Assessment/Plan:  ? ?1. Vitamin D deficiency ?Christina Brennan will continue Vit D 50,000 IU, weekly. ? ?2. Type 2 diabetes mellitus with hyperglycemia, without long-term current use of insulin (HCC) ?Good blood sugar control is important to decrease the likelihood of diabetic complications such as nephropathy, neuropathy, limb loss, blindness, coronary artery disease, and death. Intensive lifestyle modification including diet, exercise and weight loss are the first line of treatment for diabetes.   ? ?Christina Brennan declines Metformin but will continue to monitor. ? ?3. Obesity with current BMI of 35.8 ?Handouts given: IR and Metformin. ? ?Christina Brennan is currently in  the action stage of change. As such, her goal is to continue with weight loss efforts. She has agreed to the Category 2 Plan and the Category 3 Plan.  ? ?Exercise goals: As is. ? ?Behavioral modification strategies: increasing lean protein intake and increasing water intake. ? ?Christina Brennan has agreed to follow-up with our clinic in 4 weeks. She was informed of the importance of frequent follow-up visits to maximize her success with intensive lifestyle modifications for her multiple health conditions.  ? ?Objective:  ? ?Blood pressure 116/72, pulse 80, temperature 97.7 ?F (36.5 ?C), height 5\' 4"  (1.626 m), weight 208 lb (94.3 kg), SpO2 99 %. ?Body mass index is 35.7 kg/m?. ? ?General: Cooperative, alert, well developed, in no acute distress. ?HEENT: Conjunctivae and lids unremarkable. ?Cardiovascular: Regular rhythm.  ?Lungs: Normal work of breathing. ?Neurologic: No focal deficits.  ? ?Lab Results  ?Component Value Date  ? CREATININE 0.57 08/07/2021  ? BUN 12 08/07/2021  ? NA 139 08/07/2021  ? K 4.8 08/07/2021  ? CL 103 08/07/2021  ? CO2 24 08/07/2021  ? ?Lab Results  ?Component Value Date  ? ALT 25 08/07/2021  ? AST 16 08/07/2021  ? ALKPHOS 89 08/07/2021  ? BILITOT 0.3 08/07/2021  ? ?Lab Results  ?Component Value Date  ? HGBA1C 6.5 (H) 08/07/2021  ? ?Lab Results  ?Component Value Date  ? INSULIN 8.8 08/07/2021  ? ?Lab Results  ?Component Value Date  ? TSH 1.930 08/07/2021  ? ?Lab Results  ?Component Value Date  ? CHOL 224 (H) 08/07/2021  ? HDL 73 08/07/2021  ? LDLCALC 134 (H) 08/07/2021  ?  TRIG 99 08/07/2021  ? ?Lab Results  ?Component Value Date  ? VD25OH 40.1 08/07/2021  ? ?Lab Results  ?Component Value Date  ? WBC 5.8 08/07/2021  ? HGB 13.1 08/07/2021  ? HCT 41.3 08/07/2021  ? MCV 84 08/07/2021  ? PLT 296 08/07/2021  ? ?No results found for: IRON, TIBC, FERRITIN ? ?Attestation Statements:  ? ?Reviewed by clinician on day of visit: allergies, medications, problem list, medical history, surgical history, family  history, social history, and previous encounter notes. ? ?Spent 30 mins with the patient today discussing above plan of care.  ? ?I, Brendell Tyus, am acting as transcriptionist for Irene Limbo, FNP.Marland Kitchen ? ?I have reviewed the above documentation for accuracy and completeness, and I agree with the above. Irene Limbo, FNP  ?

## 2021-11-18 ENCOUNTER — Ambulatory Visit
Admission: RE | Admit: 2021-11-18 | Discharge: 2021-11-18 | Disposition: A | Discharge status: Home / Self Care | Payer: Managed Care, Other (non HMO) | Source: Ambulatory Visit | Attending: Obstetrics and Gynecology | Admitting: Obstetrics and Gynecology

## 2021-11-18 DIAGNOSIS — Z1231 Encounter for screening mammogram for malignant neoplasm of breast: Secondary | ICD-10-CM | POA: Diagnosis present

## 2021-12-04 ENCOUNTER — Ambulatory Visit (INDEPENDENT_AMBULATORY_CARE_PROVIDER_SITE_OTHER): Payer: Managed Care, Other (non HMO) | Admitting: Nurse Practitioner

## 2021-12-04 ENCOUNTER — Encounter (INDEPENDENT_AMBULATORY_CARE_PROVIDER_SITE_OTHER): Payer: Self-pay | Admitting: Nurse Practitioner

## 2021-12-04 VITALS — BP 106/68 | HR 65 | Temp 97.8°F | Ht 64.0 in | Wt 206.0 lb

## 2021-12-04 DIAGNOSIS — E038 Other specified hypothyroidism: Secondary | ICD-10-CM

## 2021-12-04 DIAGNOSIS — E039 Hypothyroidism, unspecified: Secondary | ICD-10-CM

## 2021-12-04 DIAGNOSIS — E669 Obesity, unspecified: Secondary | ICD-10-CM | POA: Diagnosis not present

## 2021-12-04 DIAGNOSIS — E559 Vitamin D deficiency, unspecified: Secondary | ICD-10-CM | POA: Diagnosis not present

## 2021-12-04 DIAGNOSIS — Z6835 Body mass index (BMI) 35.0-35.9, adult: Secondary | ICD-10-CM

## 2021-12-04 MED ORDER — LEVOTHYROXINE SODIUM 75 MCG PO TABS: 75.0000 ug | ORAL_TABLET | Freq: Every day | ORAL | 0 refills | Status: AC

## 2021-12-08 DIAGNOSIS — E559 Vitamin D deficiency, unspecified: Secondary | ICD-10-CM | POA: Insufficient documentation

## 2021-12-08 DIAGNOSIS — E038 Other specified hypothyroidism: Secondary | ICD-10-CM | POA: Insufficient documentation

## 2021-12-08 NOTE — Progress Notes (Signed)
? ? ? ?Chief Complaint:  ? ?OBESITY ?Christina Brennan is here to discuss her progress with her obesity treatment plan along with follow-up of her obesity related diagnoses. Christina Brennan is on the Category 2 Plan and the Category 3 Plan, but was unsure of which meal plan she is following, stated she was following Category 1,and states she is following her eating plan approximately 50% of the time. Christina Brennan states she is not exercising 0 minutes 0 times per week. ? ?Today's visit was #: 7 ?Starting weight: 224 lbs ?Starting date: 08/07/2021 ?Today's weight: 206 lbs ?Today's date: 12/04/2021 ?Total lbs lost to date: 18 lbs ?Total lbs lost since last in-office visit: 2 lbs ? ?Interim History: Christina Brennan has done well with weight loss since her last visit.  She has not had any vacations or traveling since her last visit.  Christina Brennan is doing well with breakfast and lunch, but struggles with dinner.  She has been eating late and eating out more.  She has been stress eating some, drinking coffee, fairlife, diet sodas and has increased water intake. Christina Brennan has complained of some hunger and cravings.  ? ?Subjective:  ? ?1. Other specified hypothyroidism ?Christina Brennan is taking Synthroid 75 mcg, denies any side effects.  Christina Brennan is seeing PCP for follow up in July.  Christina Brennan is requesting a refill until her next appointment with PCP.  Last TSH 08/07/2021. ? ?2. Vitamin D deficiency ?Pamelyn is taking Vitamin D 5,000 IU OTC.  Denies any side effects, no nausea, vomiting, or muscle weakness.  ? ? ?Assessment/Plan:  ? ?1. Other specified hypothyroidism ?Christina Brennan has agreed to continue Synthroid 75 mcg # 60.  See below.  ?We will obtain labs at next visit.  ?- levothyroxine (SYNTHROID) 75 MCG tablet; Take 1 tablet (75 mcg total) by mouth daily before breakfast.  Dispense: 60 tablet; Refill: 0 ? ?2. Vitamin D deficiency ?Christina Brennan has agreed to continue taking Vitamin D OTC.  Side effects discussed. Will continue to monitor.   ? ?Low Vitamin D level  contributes to fatigue and are associated with obesity, breast, and colon cancer.  ? ?3. Obesity with current BMI of 35.4 ?Christina Brennan has agreed to try meal prepping, and increasing water intake. We will obtain labs at next visit.  We also discussed clear eatz. ? ?Christina Brennan is currently in the action stage of change. As such, her goal is to continue with weight loss efforts. She has agreed to practicing portion control and making smarter food choices, such as increasing vegetables and decreasing simple carbohydrates.  ? ?Exercise goals:  As is. ? ?Behavioral modification strategies: increasing lean protein intake, increasing water intake, and meal planning and cooking strategies. ? ?Christina Brennan has agreed to follow-up with our clinic in 4 weeks. She was informed of the importance of frequent follow-up visits to maximize her success with intensive lifestyle modifications for her multiple health conditions.  ? ?Objective:  ? ?Blood pressure 106/68, pulse 65, temperature 97.8 ?F (36.6 ?C), height 5\' 4"  (1.626 m), weight 206 lb (93.4 kg), SpO2 98 %. ?Body mass index is 35.36 kg/m?. ? ?General: Cooperative, alert, well developed, in no acute distress. ?HEENT: Conjunctivae and lids unremarkable. ?Cardiovascular: Regular rhythm.  ?Lungs: Normal work of breathing. ?Neurologic: No focal deficits.  ? ?Lab Results  ?Component Value Date  ? CREATININE 0.57 08/07/2021  ? BUN 12 08/07/2021  ? NA 139 08/07/2021  ? K 4.8 08/07/2021  ? CL 103 08/07/2021  ? CO2 24 08/07/2021  ? ?Lab Results  ?Component Value Date  ?  ALT 25 08/07/2021  ? AST 16 08/07/2021  ? ALKPHOS 89 08/07/2021  ? BILITOT 0.3 08/07/2021  ? ?Lab Results  ?Component Value Date  ? HGBA1C 6.5 (H) 08/07/2021  ? ?Lab Results  ?Component Value Date  ? INSULIN 8.8 08/07/2021  ? ?Lab Results  ?Component Value Date  ? TSH 1.930 08/07/2021  ? ?Lab Results  ?Component Value Date  ? CHOL 224 (H) 08/07/2021  ? HDL 73 08/07/2021  ? LDLCALC 134 (H) 08/07/2021  ? TRIG 99 08/07/2021  ? ?Lab  Results  ?Component Value Date  ? VD25OH 40.1 08/07/2021  ? ?Lab Results  ?Component Value Date  ? WBC 5.8 08/07/2021  ? HGB 13.1 08/07/2021  ? HCT 41.3 08/07/2021  ? MCV 84 08/07/2021  ? PLT 296 08/07/2021  ? ?No results found for: IRON, TIBC, FERRITIN ? ?Attestation Statements:  ? ?Reviewed by clinician on day of visit: allergies, medications, problem list, medical history, surgical history, family history, social history, and previous encounter notes. ? ?I, Malcolm Metro, RMA, am acting as transcriptionist for Irene Limbo, FNP. ? ?I have reviewed the above documentation for accuracy and completeness, and I agree with the above. Irene Limbo, FNP  ?

## 2022-01-08 ENCOUNTER — Ambulatory Visit (INDEPENDENT_AMBULATORY_CARE_PROVIDER_SITE_OTHER): Payer: Managed Care, Other (non HMO) | Admitting: Nurse Practitioner

## 2022-02-05 ENCOUNTER — Ambulatory Visit (INDEPENDENT_AMBULATORY_CARE_PROVIDER_SITE_OTHER): Payer: Managed Care, Other (non HMO) | Admitting: Nurse Practitioner

## 2022-03-04 ENCOUNTER — Encounter (INDEPENDENT_AMBULATORY_CARE_PROVIDER_SITE_OTHER): Payer: Self-pay

## 2022-03-19 ENCOUNTER — Ambulatory Visit (INDEPENDENT_AMBULATORY_CARE_PROVIDER_SITE_OTHER): Payer: Managed Care, Other (non HMO) | Admitting: Nurse Practitioner

## 2022-12-11 ENCOUNTER — Other Ambulatory Visit: Payer: Self-pay | Admitting: Family Medicine

## 2022-12-11 DIAGNOSIS — Z1231 Encounter for screening mammogram for malignant neoplasm of breast: Secondary | ICD-10-CM

## 2023-01-19 ENCOUNTER — Ambulatory Visit
Admission: RE | Admit: 2023-01-19 | Discharge: 2023-01-19 | Disposition: A | Discharge status: Home / Self Care | Payer: Managed Care, Other (non HMO) | Source: Ambulatory Visit | Attending: Family Medicine | Admitting: Family Medicine

## 2023-01-19 DIAGNOSIS — Z1231 Encounter for screening mammogram for malignant neoplasm of breast: Secondary | ICD-10-CM | POA: Diagnosis present

## 2023-04-02 ENCOUNTER — Ambulatory Visit: Payer: Managed Care, Other (non HMO) | Admitting: Podiatry

## 2023-04-02 DIAGNOSIS — M7662 Achilles tendinitis, left leg: Secondary | ICD-10-CM | POA: Diagnosis not present

## 2023-04-02 DIAGNOSIS — M7661 Achilles tendinitis, right leg: Secondary | ICD-10-CM | POA: Diagnosis not present

## 2023-04-02 MED ORDER — DOXYCYCLINE HYCLATE 100 MG PO TABS: 100.0000 mg | ORAL_TABLET | Freq: Two times a day (BID) | ORAL | 0 refills | Status: AC

## 2023-04-02 NOTE — Progress Notes (Signed)
  Subjective:  Patient ID: Christina Brennan, female    DOB: 04/11/1966,  MRN: [id number]  Chief Complaint  Patient presents with   Ingrown Toenail    Left ingrown nail and bilateral heel pain    57 y.o. female presents with the above complaint.  Patient presents with bilateral Achilles tendinitis insertional pain.  Patient states that this mostly when she is standing on her feet is right now and very mild in nature.  She wanted to get evaluated and discuss treatment options/prevention technique.  She has not seen anyone else prior to seeing me denies any other acute complaints pain scale is 2 out of 10 dull achy in nature.   Review of Systems: Negative except as noted in the HPI. Denies N/V/F/Ch.  Past Medical History:  Diagnosis Date   Anxiety    Depression    Fatigue    Low vitamin D level    Muscle stiffness    Obesity    Prediabetes    Thyroid disease     Current Outpatient Medications:    doxycycline (VIBRA-TABS) 100 MG tablet, Take 1 tablet (100 mg total) by mouth 2 (two) times daily., Disp: 20 tablet, Rfl: 0   Cholecalciferol (VITAMIN D3) 125 MCG (5000 UT) CAPS, Take 1 capsule (5,000 Units total) by mouth daily., Disp: 30 capsule, Rfl: 0   levothyroxine (SYNTHROID) 75 MCG tablet, Take 1 tablet (75 mcg total) by mouth daily before breakfast., Disp: 60 tablet, Rfl: 0   Naproxen Sodium (ALEVE PO), Take 275 mg by mouth., Disp: , Rfl:   Social History   Tobacco Use  Smoking Status Former   Types: Cigarettes  Smokeless Tobacco Not on file    No Known Allergies Objective:  There were no vitals filed for this visit. There is no height or weight on file to calculate BMI. Constitutional Well developed. Well nourished.  Vascular Dorsalis pedis pulses palpable bilaterally. Posterior tibial pulses palpable bilaterally. Capillary refill normal to all digits.  No cyanosis or clubbing noted. Pedal hair growth normal.  Neurologic Normal speech. Oriented to person, place, and  time. Epicritic sensation to light touch grossly present bilaterally.  Dermatologic Nails well groomed and normal in appearance. No open wounds. No skin lesions.  Orthopedic: Bilateral Achilles tendinitis insertional pain with positive Haglund's deformity and Silfverskiold test with gastrocnemius equinus.  No pain at the posterior tibial tendon peroneal tendon and ATFL ligament.   Radiographs: None Assessment:   1. Achilles tendinitis of both lower extremities    Plan:  Patient was evaluated and treated and all questions answered.  Bilateral Achilles tendinitis mild -All questions and concerns were discussed with the patient in extensive detail.  I discussed twitching in extensive detail.  I also discussed shoe gear modification and orthotics.  For now patient would like to hold off on orthotics.  She will try over-the-counter power steps prior to starting or getting custom made orthotics.  No follow-ups on file.
